# Patient Record
Sex: Female | Born: 1950 | Race: Black or African American | Hispanic: No | Marital: Married | Smoking: Never smoker
Health system: Southern US, Community
[De-identification: ages and names within clinical notes are randomized; demographics above are authoritative.]

## PROBLEM LIST (undated history)

## (undated) DIAGNOSIS — I10 Essential (primary) hypertension: Secondary | ICD-10-CM

## (undated) DIAGNOSIS — H409 Unspecified glaucoma: Secondary | ICD-10-CM

## (undated) DIAGNOSIS — E739 Lactose intolerance, unspecified: Secondary | ICD-10-CM

## (undated) DIAGNOSIS — N6012 Diffuse cystic mastopathy of left breast: Secondary | ICD-10-CM

## (undated) DIAGNOSIS — G459 Transient cerebral ischemic attack, unspecified: Secondary | ICD-10-CM

## (undated) DIAGNOSIS — M7712 Lateral epicondylitis, left elbow: Secondary | ICD-10-CM

## (undated) DIAGNOSIS — E785 Hyperlipidemia, unspecified: Secondary | ICD-10-CM

## (undated) HISTORY — DX: Essential (primary) hypertension: I10

## (undated) HISTORY — DX: Lateral epicondylitis, left elbow: M77.12

## (undated) HISTORY — PX: EYE SURGERY: SHX253

## (undated) HISTORY — DX: Unspecified glaucoma: H40.9

## (undated) HISTORY — DX: Hyperlipidemia, unspecified: E78.5

## (undated) HISTORY — DX: Transient cerebral ischemic attack, unspecified: G45.9

## (undated) HISTORY — DX: Diffuse cystic mastopathy of left breast: N60.12

## (undated) HISTORY — DX: Lactose intolerance, unspecified: E73.9

---

## 1999-05-21 HISTORY — PX: COSMETIC SURGERY: SHX468

## 1999-07-31 ENCOUNTER — Other Ambulatory Visit: Admission: RE | Admit: 1999-07-31 | Discharge: 1999-07-31 | Payer: Self-pay | Admitting: Family Medicine

## 1999-09-05 ENCOUNTER — Encounter: Admission: RE | Admit: 1999-09-05 | Discharge: 1999-09-05 | Payer: Self-pay | Admitting: Family Medicine

## 1999-09-05 ENCOUNTER — Encounter: Payer: Self-pay | Admitting: Family Medicine

## 2001-05-07 ENCOUNTER — Other Ambulatory Visit: Admission: RE | Admit: 2001-05-07 | Discharge: 2001-05-07 | Payer: Self-pay | Admitting: Family Medicine

## 2001-05-11 ENCOUNTER — Encounter: Admission: RE | Admit: 2001-05-11 | Discharge: 2001-05-11 | Payer: Self-pay | Admitting: Family Medicine

## 2001-05-11 ENCOUNTER — Encounter: Payer: Self-pay | Admitting: Family Medicine

## 2001-09-25 ENCOUNTER — Inpatient Hospital Stay (HOSPITAL_COMMUNITY): Admission: RE | Admit: 2001-09-25 | Discharge: 2001-09-26 | Payer: Self-pay | Admitting: Obstetrics and Gynecology

## 2002-05-20 HISTORY — PX: ABDOMINAL HYSTERECTOMY: SHX81

## 2005-06-24 ENCOUNTER — Observation Stay (HOSPITAL_COMMUNITY): Admission: EM | Admit: 2005-06-24 | Discharge: 2005-06-25 | Payer: Self-pay | Admitting: Emergency Medicine

## 2005-07-08 ENCOUNTER — Encounter: Admission: RE | Admit: 2005-07-08 | Discharge: 2005-07-08 | Payer: Self-pay | Admitting: Family Medicine

## 2007-09-02 ENCOUNTER — Ambulatory Visit: Payer: Self-pay | Admitting: Ophthalmology

## 2008-03-02 ENCOUNTER — Ambulatory Visit: Payer: Self-pay | Admitting: Family Medicine

## 2010-12-24 ENCOUNTER — Emergency Department: Payer: Self-pay | Admitting: Emergency Medicine

## 2011-02-12 ENCOUNTER — Ambulatory Visit: Payer: Self-pay | Admitting: Family Medicine

## 2011-02-12 LAB — HM MAMMOGRAPHY: HM MAMMO: NORMAL

## 2011-03-20 LAB — HM COLONOSCOPY: HM Colonoscopy: NORMAL

## 2013-02-14 ENCOUNTER — Emergency Department: Payer: Self-pay | Admitting: Emergency Medicine

## 2013-02-14 LAB — BASIC METABOLIC PANEL
Anion Gap: 4 — ABNORMAL LOW (ref 7–16)
BUN: 13 mg/dL (ref 7–18)
Co2: 28 mmol/L (ref 21–32)
EGFR (African American): >60
Osmolality: 274 (ref 275–301)
Sodium: 137 mmol/L (ref 136–145)

## 2013-02-14 LAB — TROPONIN I: Troponin-I: 0.04 ng/mL

## 2013-02-14 LAB — CBC
HGB: 15 g/dL (ref 12.0–16.0)
MCH: 30 pg (ref 26.0–34.0)
MCV: 87 fL (ref 80–100)
Platelet: 311 10*3/uL (ref 150–440)
RDW: 13.2 % (ref 11.5–14.5)

## 2013-11-11 ENCOUNTER — Ambulatory Visit: Payer: Self-pay | Admitting: Family Medicine

## 2013-12-11 IMAGING — CT CT HEAD WITHOUT CONTRAST
2 series · 15 of 30 positions shown, 19 images · non-contrast
Comparison: none

REASON FOR EXAM: headache
COMMENTS:

[Series 2: soft tissue · axial · 0.43mm/px · z∈[-122,-102]mm · 2 of 32 slices shown]
[im 3/32  brain]
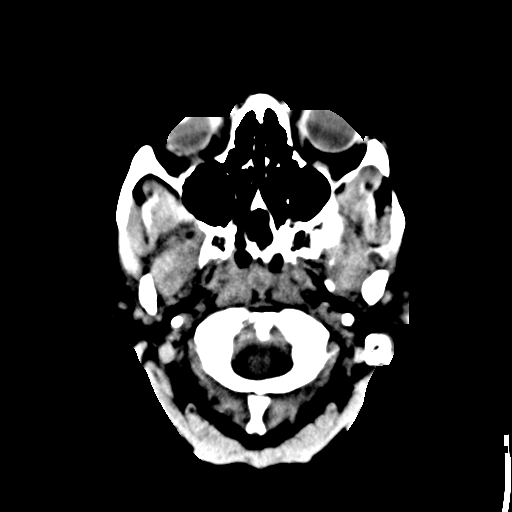
[im 7/32  brain]
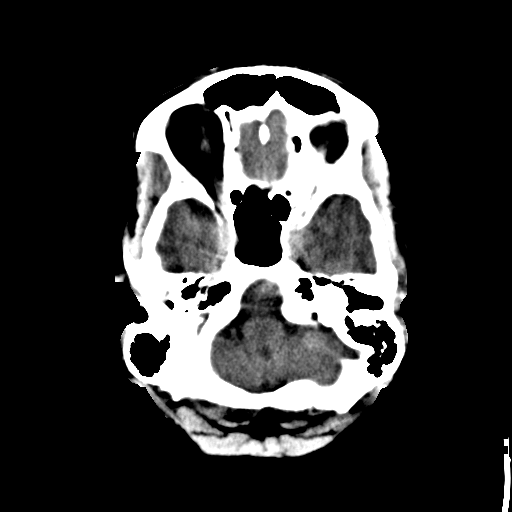

[Series 5: soft tissue recon · axial · 0.43mm/px · z∈[-157,-30]mm · 13 of 33 slices shown, 17 images]
[im 3/33  brain]
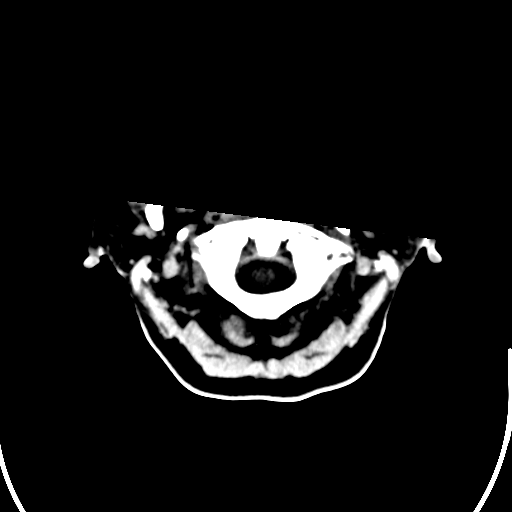
[im 3/33  bone]
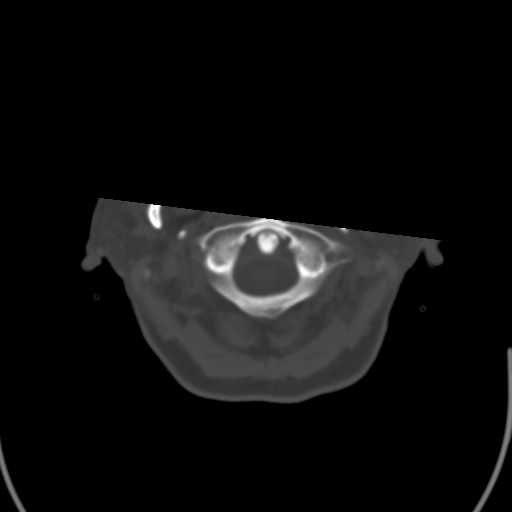
[im 5/33  brain]
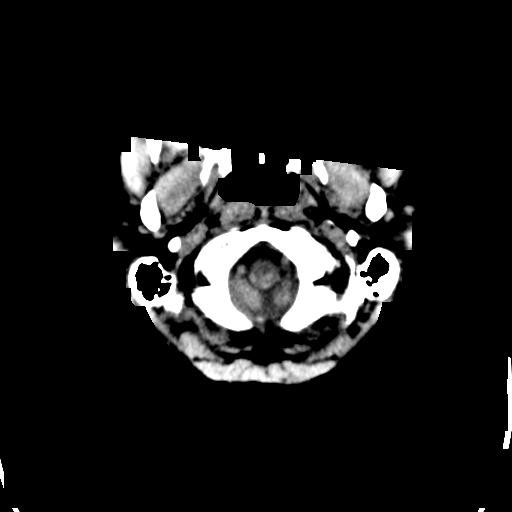
[im 7/33  brain]
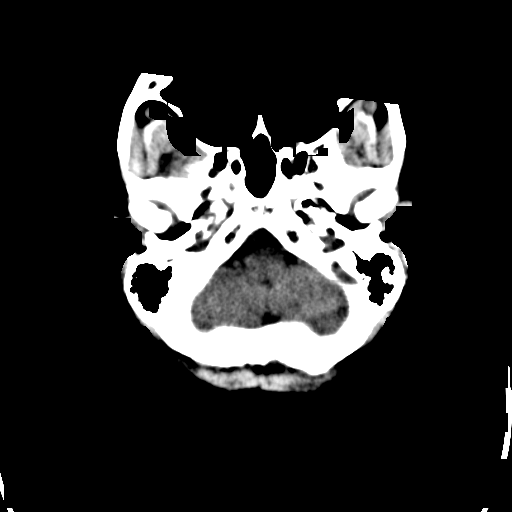
[im 10/33  brain]
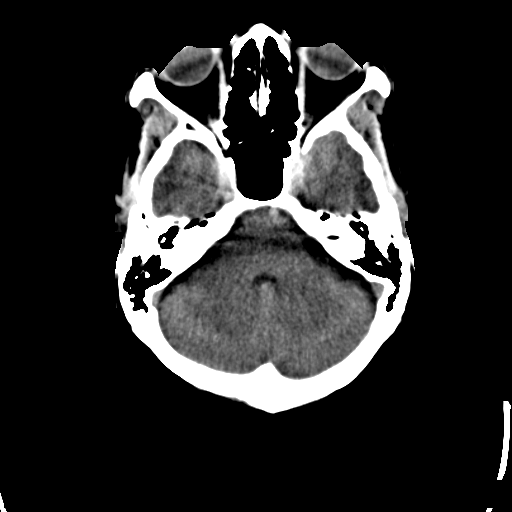
[im 12/33  brain]
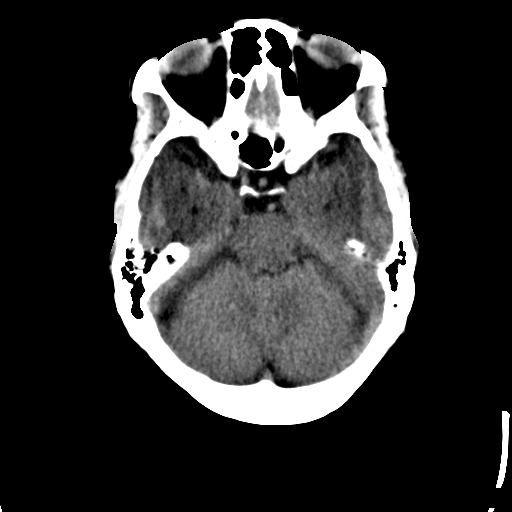
[im 12/33  bone]
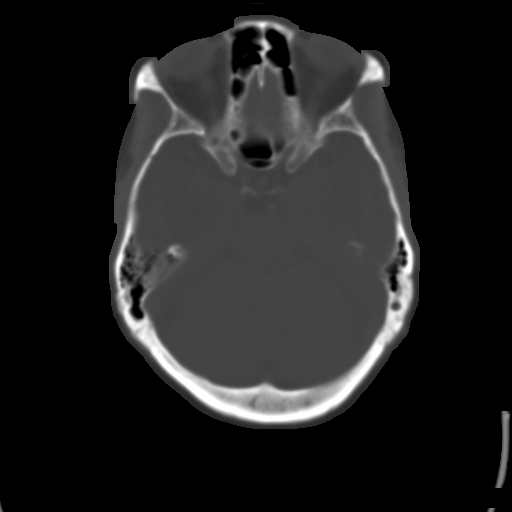
[im 14/33  brain]
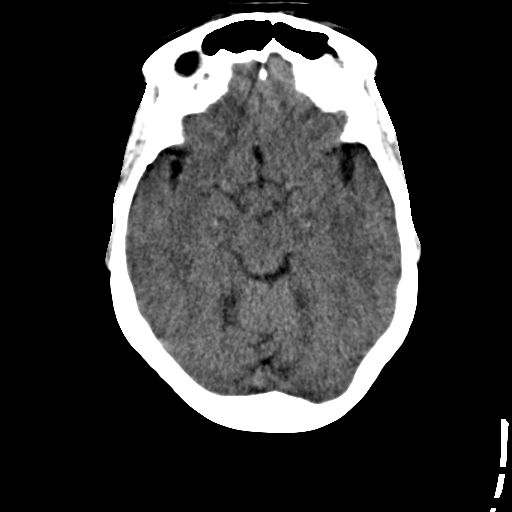
[im 17/33  brain]
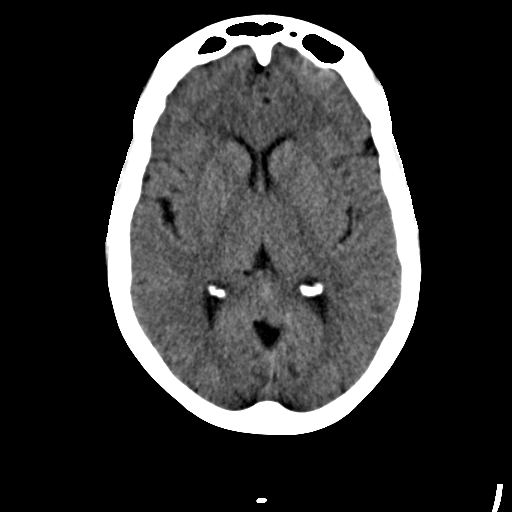
[im 19/33  brain]
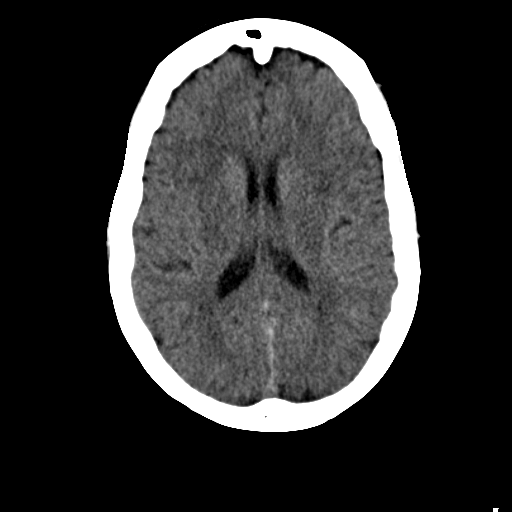
[im 21/33  brain]
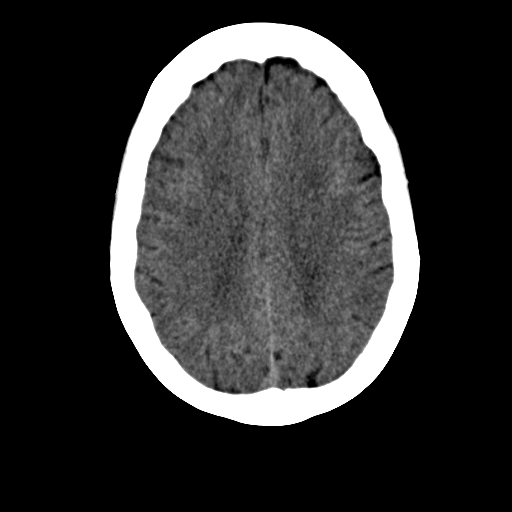
[im 21/33  bone]
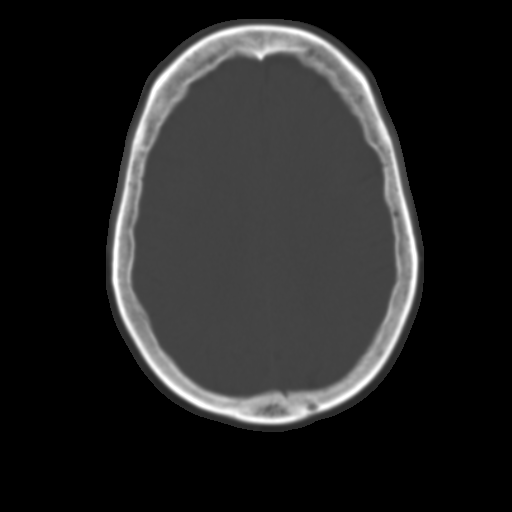
[im 23/33  brain]
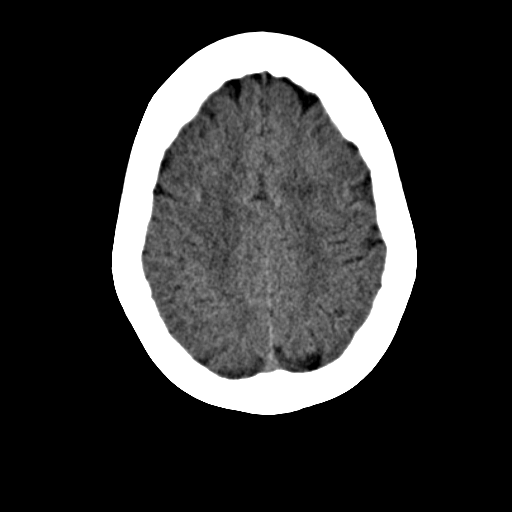
[im 26/33  brain]
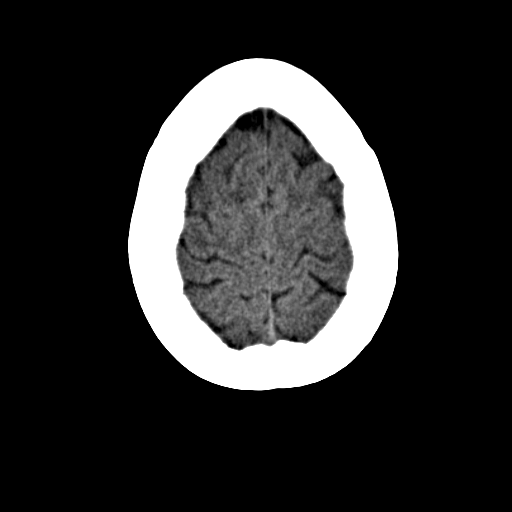
[im 28/33  brain]
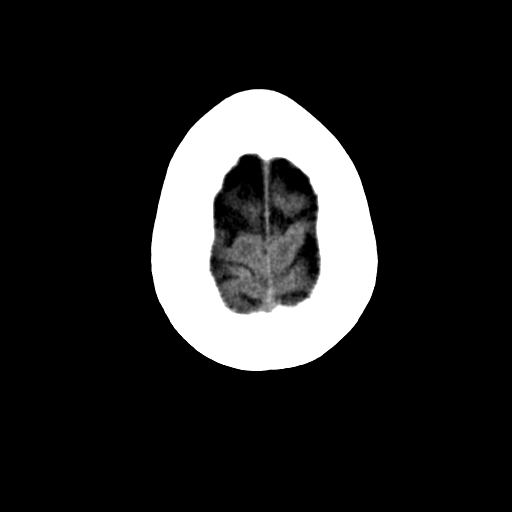
[im 30/33  brain]
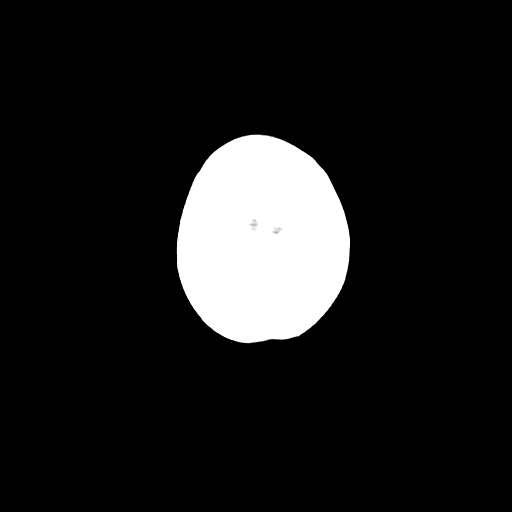
[im 30/33  bone]
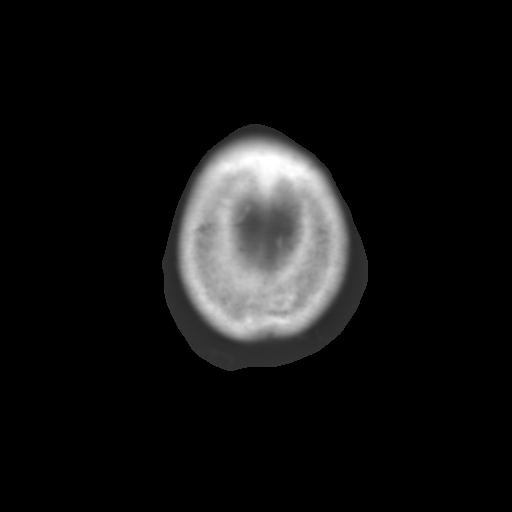

[15 of 30 positions shown; findings below may reference images not displayed]

PROCEDURE:     CT  - CT HEAD WITHOUT CONTRAST  - February 14, 2013  [DATE]

RESULT:     Axial noncontrast CT scanning was performed through the brain
with reconstructions at 5 mm intervals and slice thicknesses.

The ventricles are normal in size and position. There is no intracranial
hemorrhage nor intracranial mass effect. On images 12 and 13 there is an
approximately 8mm diameter hypodensity that may reflect the sequelae of a
previous lacunar infarction or could reflect a dilated Virchow-Robin space
in the insula on the right. The cerebellum and brainstem are normal in
density. At bone window settings the observed portions of the paranasal
sinuses and mastoid air cells are clear. There is no evidence of an acute
skull fracture.
IMPRESSION: 1. There is no evidence of an acute ischemic or hemorrhagic infarction.
2. There may be an old subcentimeter lacunar infarction in the insula on the
right.
3. There is no intracranial mass effect nor hydrocephalus.

[REDACTED]

## 2014-10-11 LAB — LIPID PANEL
Cholesterol: 138 mg/dL (ref 0–200)
HDL: 62 mg/dL (ref 35–70)
LDL Cholesterol: 63 mg/dL
Triglycerides: 66 mg/dL (ref 40–160)

## 2014-12-24 ENCOUNTER — Encounter: Payer: Self-pay | Admitting: Family Medicine

## 2014-12-24 DIAGNOSIS — E785 Hyperlipidemia, unspecified: Secondary | ICD-10-CM | POA: Insufficient documentation

## 2014-12-24 DIAGNOSIS — Z91010 Allergy to peanuts: Secondary | ICD-10-CM | POA: Insufficient documentation

## 2014-12-24 DIAGNOSIS — I10 Essential (primary) hypertension: Secondary | ICD-10-CM | POA: Insufficient documentation

## 2014-12-24 DIAGNOSIS — M7712 Lateral epicondylitis, left elbow: Secondary | ICD-10-CM | POA: Insufficient documentation

## 2014-12-24 DIAGNOSIS — H4040X0 Glaucoma secondary to eye inflammation, unspecified eye, stage unspecified: Secondary | ICD-10-CM | POA: Insufficient documentation

## 2014-12-24 DIAGNOSIS — N6019 Diffuse cystic mastopathy of unspecified breast: Secondary | ICD-10-CM | POA: Insufficient documentation

## 2014-12-24 DIAGNOSIS — Z8673 Personal history of transient ischemic attack (TIA), and cerebral infarction without residual deficits: Secondary | ICD-10-CM | POA: Insufficient documentation

## 2014-12-26 ENCOUNTER — Encounter: Payer: Self-pay | Admitting: Family Medicine

## 2015-01-30 ENCOUNTER — Other Ambulatory Visit: Payer: Self-pay | Admitting: Family Medicine

## 2015-01-30 NOTE — Telephone Encounter (Signed)
Patient requesting refill. 

## 2015-02-02 NOTE — Telephone Encounter (Signed)
Left message to return call to schedule appointment

## 2015-02-09 ENCOUNTER — Other Ambulatory Visit: Payer: Self-pay | Admitting: Family Medicine

## 2015-02-09 NOTE — Telephone Encounter (Signed)
Patient requesting refill. 

## 2015-03-08 ENCOUNTER — Other Ambulatory Visit: Payer: Self-pay

## 2015-03-08 ENCOUNTER — Telehealth: Payer: Self-pay | Admitting: Family Medicine

## 2015-03-08 MED ORDER — LISINOPRIL 40 MG PO TABS: 40.0000 mg | ORAL_TABLET | Freq: Every day | ORAL | Status: DC

## 2015-03-08 MED ORDER — AMLODIPINE-ATORVASTATIN 5-20 MG PO TABS: 1.0000 | ORAL_TABLET | Freq: Every day | ORAL | Status: DC

## 2015-03-08 NOTE — Telephone Encounter (Signed)
Needs BP and Cholesterol meds refilled. [facility] [address].

## 2015-03-13 ENCOUNTER — Ambulatory Visit (INDEPENDENT_AMBULATORY_CARE_PROVIDER_SITE_OTHER): Payer: BLUE CROSS/BLUE SHIELD | Admitting: Family Medicine

## 2015-03-13 ENCOUNTER — Encounter: Payer: Self-pay | Admitting: Family Medicine

## 2015-03-13 VITALS — BP 122/84 | HR 70 | Temp 98.6°F | Resp 16 | Ht 63.0 in | Wt 132.9 lb

## 2015-03-13 DIAGNOSIS — Z23 Encounter for immunization: Secondary | ICD-10-CM | POA: Diagnosis not present

## 2015-03-13 DIAGNOSIS — I10 Essential (primary) hypertension: Secondary | ICD-10-CM

## 2015-03-13 DIAGNOSIS — N6019 Diffuse cystic mastopathy of unspecified breast: Secondary | ICD-10-CM

## 2015-03-13 DIAGNOSIS — E785 Hyperlipidemia, unspecified: Secondary | ICD-10-CM | POA: Diagnosis not present

## 2015-03-13 DIAGNOSIS — Z1239 Encounter for other screening for malignant neoplasm of breast: Secondary | ICD-10-CM

## 2015-03-13 DIAGNOSIS — N63 Unspecified lump in unspecified breast: Secondary | ICD-10-CM

## 2015-03-13 MED ORDER — AMLODIPINE-ATORVASTATIN 5-20 MG PO TABS: 1.0000 | ORAL_TABLET | Freq: Every day | ORAL | Status: DC

## 2015-03-13 MED ORDER — LISINOPRIL 40 MG PO TABS: 40.0000 mg | ORAL_TABLET | Freq: Every day | ORAL | Status: DC

## 2015-03-13 NOTE — Progress Notes (Signed)
Name: Bianca Suarez   MRN: [id number]    DOB: 02-Aug-1950   Date:03/13/2015       Progress Note  Subjective  Chief Complaint  Chief Complaint  Patient presents with  . Medication Refill    follow-up  . Hypertension  . Hyperlipidemia    some leg cramps    HPI  HTN: taking bp medication daily, but ran out a couple of weeks ago of Lisinopril, no cough, no chest pain or palpitation.   Hyperlipidemia: taking Caduet and last lipid panel was done 09/2104 and LDl was 63.  Continue medication, previous history of TIA, but no neuro deficits at this time. She states she has episodes of feet cramps, when she moves her foot in a certain way.   Fibrocystic breast disease: had a diagnostic mammogram on the left side, years ago, still has some intermittent pain on left breast.  No masses, no nipple discharge.    Patient Active Problem List   Diagnosis Date Noted  . Dyslipidemia 12/24/2014  . Fibrocystic breast disease 12/24/2014  . 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 H/O transient cerebral ischemia 12/24/2014  . Benign hypertension 12/24/2014  . Lateral epicondylitis of left elbow 12/24/2014    Past Surgical History  Procedure Laterality Date  . Abdominal hysterectomy  [id number]  . Eye surgery    . Cosmetic surgery  2001    lens replacement    Family History  Problem Relation Age of Onset  . Diabetes Mother   . Hypertension Mother   . Hypertension Father   . CVA Father     Social History   Social History  . Marital Status: Married    Spouse Name: N/A  . Number of Children: N/A  . Years of Education: N/A   Occupational History  . Not on file.   Social History Main Topics  . Smoking status: Never Smoker   . Smokeless tobacco: Never Used  . Alcohol Use: Not on file  . Drug Use: No  . Sexual Activity: Yes   Other Topics Concern  . Not on file   Social History Narrative     Current outpatient  prescriptions:  .  aspirin 81 MG tablet, Take 1 tablet by mouth daily., Disp: , Rfl:  .  amLODipine-atorvastatin (CADUET) 5-20 MG tablet, Take 1 tablet by mouth daily., Disp: 90 tablet, Rfl: 1 .  lisinopril (PRINIVIL,ZESTRIL) 40 MG tablet, Take 1 tablet (40 mg total) by mouth daily., Disp: 90 tablet, Rfl: 1  Allergies  Allergen Reactions  . Peanut Oil      ROS  Constitutional: Negative for fever or weight change.  Respiratory: Negative for cough and shortness of breath.   Cardiovascular: Negative for chest pain or palpitations.  Gastrointestinal: Negative for abdominal pain, no bowel changes.  Musculoskeletal: Negative for gait problem or joint swelling.  Skin: Negative for rash.  Neurological: Negative for dizziness or headache.  No other specific complaints in a complete review of systems (except as listed in HPI above).  Objective  Filed Vitals:   03/13/15 1422  BP: 122/84  Pulse: 70  Temp: 98.6 F (37 C)  TempSrc: Oral  Resp: 16  Height: 5\' 3"  (1.6 m)  Weight: 132 lb 14.4 oz (60.283 kg)  SpO2: 98%    Body mass index is 23.55 kg/(m^2).  Physical Exam  Constitutional: Patient appears well-developed and well-nourished.  No distress.  HEENT: head atraumatic, normocephalic, pupils equal and reactive to light,  neck supple, throat within normal limits Cardiovascular: Normal rate, regular rhythm and normal heart sounds.  No murmur heard. No BLE edema. Pulmonary/Chest: Effort normal and breath sounds normal. No respiratory distress. Abdominal: Soft.  There is no tenderness. Psychiatric: Patient has a normal mood and affect. behavior is normal. Judgment and thought content normal.  PHQ2/9: Depression screen PHQ 2/9 03/13/2015  Decreased Interest 0  Down, Depressed, Hopeless 0  PHQ - 2 Score 0    Fall Risk: Fall Risk  03/13/2015 03/13/2015  Falls in the past year? Yes Yes  Number falls in past yr: 1 1  Injury with Fall? No No    Functional Status Survey: Is the  patient deaf or have difficulty hearing?: No Does the patient have difficulty seeing, even when wearing glasses/contacts?: Yes (glasses) Does the patient have difficulty concentrating, remembering, or making decisions?: No Does the patient have difficulty walking or climbing stairs?: No Does the patient have difficulty dressing or bathing?: No Does the patient have difficulty doing errands alone such as visiting a doctor's office or shopping?: No    Assessment & Plan  1. Benign hypertension  Doing well, continue medication  - lisinopril (PRINIVIL,ZESTRIL) 40 MG tablet; Take 1 tablet (40 mg total) by mouth daily.  Dispense: 90 tablet; Refill: 1 - amLODipine-atorvastatin (CADUET) 5-20 MG tablet; Take 1 tablet by mouth daily.  Dispense: 90 tablet; Refill: 1  2. Needs flu shot  - Flu Vaccine QUAD 36+ mos PF IM (Fluarix & Fluzone Quad PF) - refused  3. Breast cancer screening  - MM Digital Diagnostic Bilat; Future  4. Need for shingles vaccine  - Varicella-zoster vaccine subcutaneous -refused  5. Dyslipidemia  Continue Caduet  6. Fibrocystic breast disease, unspecified laterality  Reassurance, but due for mammogram

## 2015-09-14 ENCOUNTER — Encounter: Payer: BLUE CROSS/BLUE SHIELD | Admitting: Family Medicine

## 2015-09-20 ENCOUNTER — Other Ambulatory Visit: Payer: Self-pay | Admitting: Family Medicine

## 2015-09-20 NOTE — Telephone Encounter (Signed)
Patient requesting refill. 

## 2015-10-05 ENCOUNTER — Encounter: Payer: Self-pay | Admitting: Family Medicine

## 2015-10-05 ENCOUNTER — Ambulatory Visit (INDEPENDENT_AMBULATORY_CARE_PROVIDER_SITE_OTHER): Payer: BLUE CROSS/BLUE SHIELD | Admitting: Family Medicine

## 2015-10-05 VITALS — BP 182/94 | HR 88 | Temp 97.5°F | Resp 16 | Ht 63.0 in | Wt 140.0 lb

## 2015-10-05 DIAGNOSIS — E785 Hyperlipidemia, unspecified: Secondary | ICD-10-CM

## 2015-10-05 DIAGNOSIS — M25362 Other instability, left knee: Secondary | ICD-10-CM

## 2015-10-05 DIAGNOSIS — I10 Essential (primary) hypertension: Secondary | ICD-10-CM | POA: Diagnosis not present

## 2015-10-05 DIAGNOSIS — Z0000 Encounter for general adult medical examination without abnormal findings: Secondary | ICD-10-CM

## 2015-10-05 DIAGNOSIS — M25461 Effusion, right knee: Secondary | ICD-10-CM

## 2015-10-05 DIAGNOSIS — Z01419 Encounter for gynecological examination (general) (routine) without abnormal findings: Secondary | ICD-10-CM

## 2015-10-05 MED ORDER — AMLODIPINE-ATORVASTATIN 10-20 MG PO TABS: 1.0000 | ORAL_TABLET | Freq: Every day | ORAL | Status: DC

## 2015-10-05 NOTE — Progress Notes (Signed)
Name: Bianca Suarez   MRN: [id number]    DOB: 1951-02-23   Date:10/05/2015       Progress Note  Subjective  Chief Complaint  Chief Complaint  Patient presents with  . Annual Exam    patient is here for CPE, no PAP    HPI  Well Woman: history of hysterectomy for DUB years ago, she never had abnormal pap smears. No vaginal discharge. She has mild stress incontinence.   HTN: bp is up today, last visit it was normal, patient states currently generic Caduet and does not seem to be working for her, she will try to fill at a different pharmacy. Advised to monitor bp at home  we will change dose of Caduet to 10/20. No chest pain or palpitation  Hyperlipidemia: taking Atorvastatin, no myalgias.  Right knee Effusion: she has noticed swelling on the right knee, no pain, no increase in warmth  Left knee instability: she states that left knee gives out when she is going steps and sometimes the leg rolls a little when walking, no pain, symptoms started over the past year, it is not getting worse.    Patient Active Problem List   Diagnosis Date Noted  . Dyslipidemia 12/24/2014  . Fibrocystic breast disease 12/24/2014  . 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 H/O transient cerebral ischemia 12/24/2014  . Benign hypertension 12/24/2014  . Lateral epicondylitis of left elbow 12/24/2014    Past Surgical History  Procedure Laterality Date  . Abdominal hysterectomy  [id number]  . Eye surgery    . Cosmetic surgery  2001    lens replacement    Family History  Problem Relation Age of Onset  . Diabetes Mother   . Hypertension Mother   . Hypertension Father   . CVA Father     Social History   Social History  . Marital Status: Married    Spouse Name: N/A  . Number of Children: N/A  . Years of Education: N/A   Occupational History  . Not on file.   Social History Main Topics  . Smoking status: Never Smoker   . Smokeless  tobacco: Never Used  . Alcohol Use: Not on file  . Drug Use: No  . Sexual Activity: Yes   Other Topics Concern  . Not on file   Social History Narrative     Current outpatient prescriptions:  .  aspirin 81 MG tablet, Take 1 tablet by mouth daily., Disp: , Rfl:  .  lisinopril (PRINIVIL,ZESTRIL) 40 MG tablet, Take 1 tablet (40 mg total) by mouth daily., Disp: 90 tablet, Rfl: 1 .  amlodipine-atorvastatin (CADUET) 10-20 MG tablet, Take 1 tablet by mouth daily., Disp: 30 tablet, Rfl: 0  Allergies  Allergen Reactions  . Peanut Oil Diarrhea     ROS  Constitutional: Negative for fever , positive for weight change.  Respiratory: Negative for cough and shortness of breath.   Cardiovascular: Negative for chest pain or palpitations.  Gastrointestinal: Negative for abdominal pain, no bowel changes.  Musculoskeletal: Positive  for gait problem or joint swelling.  Skin: Negative for rash.  Neurological: Negative for dizziness or headache.  No other specific complaints in a complete review of systems (except as listed in HPI above).  Objective  Filed Vitals:   10/05/15 1129 10/05/15 1216  BP: 158/96 182/94  Pulse: 88   Temp: 97.5 F (36.4 C)   TempSrc: Oral   Resp: 16   Height: 5\' 3"  (  1.6 m)   Weight: 140 lb (63.504 kg)   SpO2: 97%     Body mass index is 24.81 kg/(m^2).  Physical Exam  Constitutional: Patient appears well-developed and well-nourished. No distress.  HENT: Head: Normocephalic and atraumatic. Ears: B TMs ok, no erythema or effusion; Nose: Nose normal. Mouth/Throat: Oropharynx is clear and moist. No oropharyngeal exudate.  Eyes: Conjunctivae and EOM are normal. Pupils are equal, round, and reactive to light. No scleral icterus.  Neck: Normal range of motion. Neck supple. No JVD present. No thyromegaly present.  Cardiovascular: Normal rate, regular rhythm and normal heart sounds.  No murmur heard. No BLE edema. Pulmonary/Chest: Effort normal and breath sounds  normal. No respiratory distress. Abdominal: Soft. Bowel sounds are normal, no distension. There is no tenderness. no masses Breast: no lumps or masses, no nipple discharge or rashes FEMALE GENITALIA:  Not done RECTAL: not done Musculoskeletal: Normal range of motion, no joint effusions. No gross deformities Neurological: he is alert and oriented to person, place, and time. No cranial nerve deficit. Coordination, balance, strength, speech and gait are normal.  Skin: Skin is warm and dry. No rash noted. No erythema.  Psychiatric: Patient has a normal mood and affect. behavior is normal. Judgment and thought content normal.   PHQ2/9: Depression screen [facility] 2/9 10/05/2015 03/13/2015  Decreased Interest 0 0  Down, Depressed, Hopeless 0 0  PHQ - 2 Score 0 0    Fall Risk: Fall Risk  10/05/2015 03/13/2015 03/13/2015  Falls in the past year? No Yes Yes  Number falls in past yr: - 1 1  Injury with Fall? - No No    Functional Status Survey: Is the patient deaf or have difficulty hearing?: No Does the patient have difficulty seeing, even when wearing glasses/contacts?: No (patient stated that she thinks she has eczema or psorasis in one of her eyes) Does the patient have difficulty concentrating, remembering, or making decisions?: No Does the patient have difficulty walking or climbing stairs?: Yes (some issues with balance at times) Does the patient have difficulty dressing or bathing?: No Does the patient have difficulty doing errands alone such as visiting a doctor's office or shopping?: No    Assessment & Plan  1. Well woman exam  Discussed with adolescent  and caregiver the importance of limiting screen time to no more than 2 hours per day, exercise daily for at least 2 hours, eat 6 servings of fruit and vegetables daily, eat tree nuts ( pistachios, pecans , almonds...) one serving every other day, eat fish twice weekly. Read daily. Get involved in school. Have responsibilities  at  home. To avoid STI's, practice abstinence, if unable use condoms and stick with one partner.  Discussed importance of contraception if sexually active to avoid unwanted pregnancy.   2. Benign hypertension  - Comprehensive metabolic panel BP is not controlled, explained we will increase dose and follow up in one month  3. Dyslipidemia  - Lipid panel  4. Knee effusion, right  - Ambulatory referral to Orthopedic Surgery  5. Knee instability, left  - Ambulatory referral to Orthopedic Surgery

## 2015-11-17 ENCOUNTER — Other Ambulatory Visit: Payer: Self-pay | Admitting: Family Medicine

## 2016-01-10 ENCOUNTER — Other Ambulatory Visit: Payer: Self-pay | Admitting: Family Medicine

## 2016-01-10 DIAGNOSIS — I10 Essential (primary) hypertension: Secondary | ICD-10-CM

## 2016-01-10 NOTE — Telephone Encounter (Signed)
Patient requesting refill of Lisinopril be sent to [facility] in [place].

## 2016-02-10 ENCOUNTER — Telehealth: Payer: Self-pay | Admitting: Family Medicine

## 2016-02-10 DIAGNOSIS — I10 Essential (primary) hypertension: Secondary | ICD-10-CM

## 2016-02-12 NOTE — Telephone Encounter (Signed)
Appointment made for 04/02/16.

## 2016-04-02 ENCOUNTER — Encounter: Payer: Self-pay | Admitting: Family Medicine

## 2016-04-02 ENCOUNTER — Ambulatory Visit (INDEPENDENT_AMBULATORY_CARE_PROVIDER_SITE_OTHER): Payer: BLUE CROSS/BLUE SHIELD | Admitting: Family Medicine

## 2016-04-02 VITALS — BP 172/102 | HR 68 | Temp 98.1°F | Resp 16 | Ht 63.0 in | Wt 138.9 lb

## 2016-04-02 DIAGNOSIS — I10 Essential (primary) hypertension: Secondary | ICD-10-CM

## 2016-04-02 DIAGNOSIS — E785 Hyperlipidemia, unspecified: Secondary | ICD-10-CM | POA: Diagnosis not present

## 2016-04-02 DIAGNOSIS — M1711 Unilateral primary osteoarthritis, right knee: Secondary | ICD-10-CM | POA: Diagnosis not present

## 2016-04-02 DIAGNOSIS — Z9181 History of falling: Secondary | ICD-10-CM | POA: Diagnosis not present

## 2016-04-02 LAB — CBC WITH DIFFERENTIAL/PLATELET
BASOS ABS: 51 {cells}/uL (ref 0–200)
Basophils Relative: 1 %
EOS PCT: 1 %
Eosinophils Absolute: 51 cells/uL (ref 15–500)
HEMATOCRIT: 41.1 % (ref 35.0–45.0)
Hemoglobin: 14 g/dL (ref 11.7–15.5)
LYMPHS PCT: 32 %
Lymphs Abs: 1632 cells/uL (ref 850–3900)
MCH: 29.9 pg (ref 27.0–33.0)
MCHC: 34.1 g/dL (ref 32.0–36.0)
MCV: 87.8 fL (ref 80.0–100.0)
MONO ABS: 408 {cells}/uL (ref 200–950)
MPV: 8.7 fL (ref 7.5–12.5)
Monocytes Relative: 8 %
NEUTROS PCT: 58 %
Neutro Abs: 2958 cells/uL (ref 1500–7800)
Platelets: 337 10*3/uL (ref 140–400)
RBC: 4.68 MIL/uL (ref 3.80–5.10)
RDW: 13.4 % (ref 11.0–15.0)
WBC: 5.1 10*3/uL (ref 3.8–10.8)

## 2016-04-02 LAB — COMPLETE METABOLIC PANEL WITH GFR
ALBUMIN: 4.5 g/dL (ref 3.6–5.1)
ALK PHOS: 99 U/L (ref 33–130)
ALT: 25 U/L (ref 6–29)
AST: 23 U/L (ref 10–35)
BUN: 13 mg/dL (ref 7–25)
CALCIUM: 10 mg/dL (ref 8.6–10.4)
CO2: 27 mmol/L (ref 20–31)
CREATININE: 0.87 mg/dL (ref 0.50–0.99)
Chloride: 104 mmol/L (ref 98–110)
GFR, Est African American: 82 mL/min (ref >=60)
GFR, Est Non African American: 71 mL/min (ref >=60)
GLUCOSE: 96 mg/dL (ref 65–99)
Potassium: 5.1 mmol/L (ref 3.5–5.3)
Sodium: 139 mmol/L (ref 135–146)
TOTAL PROTEIN: 7.6 g/dL (ref 6.1–8.1)
Total Bilirubin: 0.9 mg/dL (ref 0.2–1.2)

## 2016-04-02 LAB — LIPID PANEL
Cholesterol: 153 mg/dL (ref <200)
HDL: 72 mg/dL (ref >50)
LDL CALC: 71 mg/dL (ref <100)
TRIGLYCERIDES: 51 mg/dL (ref <150)
Total CHOL/HDL Ratio: 2.1 Ratio (ref <5.0)
VLDL: 10 mg/dL (ref <30)

## 2016-04-02 LAB — POCT UA - MICROALBUMIN: Microalbumin Ur, POC: 20 mg/L

## 2016-04-02 LAB — TSH: TSH: 0.81 mIU/L

## 2016-04-02 MED ORDER — HYDROCHLOROTHIAZIDE 12.5 MG PO TABS: 12.5000 mg | ORAL_TABLET | Freq: Every day | ORAL | 0 refills | Status: DC

## 2016-04-02 NOTE — Progress Notes (Addendum)
Name: Bianca Suarez   MRN: [id number]    DOB: February 26, 1951   Date:04/02/2016       Progress Note  Subjective  Chief Complaint  Chief Complaint  Patient presents with  . Medication Refill    6 month F/U  . Hypertension    Denies any side effect of HTN  . Hyperlipidemia    Patient states she only takes half a tablet daily due to being too strong-Due to making her stay up all night and not being able to fall asleep. When she takes a half she can go to sleep normally.    HPI  HTN: bp is uncontrolled, no chest pain or palpitation, she is only taking half dose of Caduet because it affected her sleep, taking Lisinopril, bp is very high , we will check labs and if no improvement in bp within the next month we will refer her to Nephrologist   Hyperlipidemia: taking Atorvastatin, no myalgias. She has occasional left foot cramp, but resolved with a soap under her bed.  Right knee Effusion/ Right knee OA: she has noticed swelling on the right knee, no pain, no increase in warmth, she has some grinding on her knee, she prefers not to have  X-ray at this time.   Patient Active Problem List   Diagnosis Date Noted  . Dyslipidemia 12/24/2014  . Fibrocystic breast disease 12/24/2014  . 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 H/O transient cerebral ischemia 12/24/2014  . Benign hypertension 12/24/2014  . Lateral epicondylitis of left elbow 12/24/2014    Past Surgical History:  Procedure Laterality Date  . ABDOMINAL HYSTERECTOMY  [id number]  . COSMETIC SURGERY  2001   lens replacement  . EYE SURGERY      Family History  Problem Relation Age of Onset  . Diabetes Mother   . Hypertension Mother   . Hypertension Father   . CVA Father     Social History   Social History  . Marital status: Married    Spouse name: N/A  . Number of children: N/A  . Years of education: N/A   Occupational History  . Not on file.   Social History  Main Topics  . Smoking status: Never Smoker  . Smokeless tobacco: Never Used  . Alcohol use No  . Drug use: No  . Sexual activity: Yes    Partners: Male   Other Topics Concern  . Not on file   Social History Narrative  . No narrative on file     Current Outpatient Prescriptions:  .  amlodipine-atorvastatin (CADUET) 10-20 MG tablet, TAKE ONE TABLET BY MOUTH ONCE DAILY (Patient taking differently: TAKE A HALF OF TABLET BY MOUTH ONCE DAILY), Disp: 30 tablet, Rfl: 5 .  aspirin 81 MG tablet, Take 1 tablet by mouth daily., Disp: , Rfl:  .  lisinopril (PRINIVIL,ZESTRIL) 40 MG tablet, TAKE 1 TABLET(40 MG) BY MOUTH DAILY, Disp: 90 tablet, Rfl: 0 .  hydrochlorothiazide (HYDRODIURIL) 12.5 MG tablet, Take 1 tablet (12.5 mg total) by mouth daily., Disp: 30 tablet, Rfl: 0  Allergies  Allergen Reactions  . Peanut Oil Diarrhea     ROS  Constitutional: Negative for fever or weight change.  Respiratory: Negative for cough and shortness of breath.   Cardiovascular: Negative for chest pain or palpitations.  Gastrointestinal: Negative for abdominal pain, no bowel changes.  Musculoskeletal: Negative for gait problem or joint swelling.  Skin: Negative for rash.  Neurological: Negative for dizziness or  headache.  No other specific complaints in a complete review of systems (except as listed in HPI above).  Objective  Vitals:   04/02/16 0925 04/02/16 1019  BP: (!) 184/102 (!) 172/102  Pulse: 68   Resp: 16   Temp: 98.1 F (36.7 C)   TempSrc: Oral   SpO2: 97%   Weight: 138 lb 14.4 oz (63 kg)   Height: 5\' 3"  (1.6 m)     Body mass index is 24.61 kg/m.  Physical Exam  Constitutional: Patient appears well-developed and well-nourished.  No distress.  HEENT: head atraumatic, normocephalic, pupils equal and reactive to light,  neck supple, throat within normal limits Cardiovascular: Normal rate, regular rhythm and normal heart sounds.  No murmur heard. No BLE edema. Pulmonary/Chest:  Effort normal and breath sounds normal. No respiratory distress. Abdominal: Soft.  There is no tenderness. Psychiatric: Patient has a normal mood and affect. behavior is normal. Judgment and thought content normal.  PHQ2/9: Depression screen [facility] 2/9 04/02/2016 10/05/2015 03/13/2015  Decreased Interest 0 0 0  Down, Depressed, Hopeless 0 0 0  PHQ - 2 Score 0 0 0     Fall Risk: Fall Risk  04/02/2016 10/05/2015 03/13/2015 03/13/2015  Falls in the past year? Yes No Yes Yes  Number falls in past yr: 1 - 1 1  Injury with Fall? No - No No     Functional Status Survey: Is the patient deaf or have difficulty hearing?: No Does the patient have difficulty seeing, even when wearing glasses/contacts?: No Does the patient have difficulty concentrating, remembering, or making decisions?: No Does the patient have difficulty walking or climbing stairs?: No Does the patient have difficulty dressing or bathing?: No Does the patient have difficulty doing errands alone such as visiting a doctor's office or shopping?: No    Assessment & Plan  1. Uncontrolled hypertension  - EKG 12-Lead - hydrochlorothiazide (HYDRODIURIL) 12.5 MG tablet; Take 1 tablet (12.5 mg total) by mouth daily.  Dispense: 30 tablet; Refill: 0 - POCT UA - Microalbumin - TSH - COMPLETE METABOLIC PANEL WITH GFR - CBC with Differential/Platelet - Lipid panel - C-reactive protein  2. Dyslipidemia  - Lipid panel  3. Primary osteoarthritis of right knee  Reassurance, no pain at this time  4. History of recent fall  Lack of balance when she reached to pick up something on the floor, advised PT, but she wants to hold off.

## 2016-04-03 LAB — C-REACTIVE PROTEIN: CRP: 0.5 mg/L (ref <8.0)

## 2016-05-06 ENCOUNTER — Encounter: Payer: Self-pay | Admitting: Family Medicine

## 2016-05-06 ENCOUNTER — Ambulatory Visit (INDEPENDENT_AMBULATORY_CARE_PROVIDER_SITE_OTHER): Payer: BLUE CROSS/BLUE SHIELD | Admitting: Family Medicine

## 2016-05-06 VITALS — BP 158/98 | HR 80 | Temp 98.1°F | Resp 16 | Ht 63.0 in | Wt 139.5 lb

## 2016-05-06 DIAGNOSIS — I10 Essential (primary) hypertension: Secondary | ICD-10-CM | POA: Diagnosis not present

## 2016-05-06 DIAGNOSIS — R0789 Other chest pain: Secondary | ICD-10-CM

## 2016-05-06 MED ORDER — VALSARTAN-HYDROCHLOROTHIAZIDE 320-25 MG PO TABS: 1.0000 | ORAL_TABLET | Freq: Every day | ORAL | 0 refills | Status: DC

## 2016-05-06 MED ORDER — AMLODIPINE-ATORVASTATIN 10-20 MG PO TABS: 0.5000 | ORAL_TABLET | Freq: Two times a day (BID) | ORAL | 5 refills | Status: DC

## 2016-05-06 NOTE — Progress Notes (Signed)
Name: Bianca Suarez   MRN: [id number]    DOB: 06-24-1950   Date:05/06/2016       Progress Note  Subjective  Chief Complaint  Chief Complaint  Patient presents with  . Follow-up    1 month F/U  . Hypertension    Started since last visit taking a whole pill of Caduet instead of half of one. Patient denies any side effects, never started the HCTZ. Takes Lisinopril 1/2 in the morning and the other half at night and helps her sleep.    HPI  HTN: bp is uncontrolled,  palpitation, she is only taking half dose of Caduet because it affected her sleep, but since last visit she has been half of the Caduet twice daily, lisinopril in am, but she never started taking HCTZ.  BP is still high, we will change to Valsartan/hctz and recheck in one month. Start with half dose and increase to one pill after after one week.   Chest pain: she noticed mild dull ache on the left side of chest yesterday, previous episode about 8 months ago. She states that chest pain lasted almost all day, but resolved by itself. She has noticed some hoarseness and a mild cough over the past few days. She felt like it was after a coughing spell. No wheezing, SOB or fever. She has been painting lately and denies decrease in exercise tolerance. She is not taking aspirin   Patient Active Problem List   Diagnosis Date Noted  . Dyslipidemia 12/24/2014  . Fibrocystic breast disease 12/24/2014  . 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 H/O transient cerebral ischemia 12/24/2014  . Benign hypertension 12/24/2014  . Lateral epicondylitis of left elbow 12/24/2014    Past Surgical History:  Procedure Laterality Date  . ABDOMINAL HYSTERECTOMY  [id number]  . COSMETIC SURGERY  2001   lens replacement  . EYE SURGERY      Family History  Problem Relation Age of Onset  . Diabetes Mother   . Hypertension Mother   . Hypertension Father   . CVA Father     Social History    Social History  . Marital status: Married    Spouse name: N/A  . Number of children: N/A  . Years of education: N/A   Occupational History  . Not on file.   Social History Main Topics  . Smoking status: Never Smoker  . Smokeless tobacco: Never Used  . Alcohol use No  . Drug use: No  . Sexual activity: Yes    Partners: Male   Other Topics Concern  . Not on file   Social History Narrative  . No narrative on file     Current Outpatient Prescriptions:  .  amlodipine-atorvastatin (CADUET) 10-20 MG tablet, Take 0.5 tablets by mouth 2 (two) times daily., Disp: 30 tablet, Rfl: 5 .  aspirin 81 MG tablet, Take 1 tablet by mouth daily., Disp: , Rfl:  .  valsartan-hydrochlorothiazide (DIOVAN-HCT) 320-25 MG tablet, Take 1 tablet by mouth daily., Disp: 30 tablet, Rfl: 0  Allergies  Allergen Reactions  . Peanut Oil Diarrhea     ROS  Ten systems reviewed and is negative except as mentioned in HPI   Objective  Vitals:   05/06/16 1040  BP: (!) 158/98  Pulse: 80  Resp: 16  Temp: 98.1 F (36.7 C)  TempSrc: Oral  SpO2: 98%  Weight: 139 lb 8 oz (63.3 kg)  Height: _0  (1.6 m)  Body mass index is 24.71 kg/m.  Physical Exam  Constitutional: Patient appears well-developed and well-nourished.  No distress.  HEENT: head atraumatic, normocephalic, pupils equal and reactive to light,  neck supple, throat within normal limits Cardiovascular: Normal rate, regular rhythm and normal heart sounds.  No murmur heard. No BLE edema. Pulmonary/Chest: Effort normal and breath sounds normal. No respiratory distress. Abdominal: Soft.  There is no tenderness. Psychiatric: Patient has a normal mood and affect. behavior is normal. Judgment and thought content normal.   Recent Results (from the past 2160 hour(s))  POCT UA - Microalbumin     Status: None   Collection Time: 04/02/16 10:24 AM  Result Value Ref Range   Microalbumin Ur, POC 20 mg/L   Creatinine, POC  mg/dL    Albumin/Creatinine Ratio, Urine, POC    TSH     Status: None   Collection Time: 04/02/16 11:07 AM  Result Value Ref Range   TSH 0.81 mIU/L    Comment:   Reference Range   > or = 20 Years  0.40-4.50   Pregnancy Range First trimester  0.26-2.66 Second trimester 0.55-2.73 Third trimester  0.43-2.91     COMPLETE METABOLIC PANEL WITH GFR     Status: None   Collection Time: 04/02/16 11:07 AM  Result Value Ref Range   Sodium 139 135 - 146 mmol/L   Potassium 5.1 3.5 - 5.3 mmol/L   Chloride 104 98 - 110 mmol/L   CO2 27 20 - 31 mmol/L   Glucose, Bld 96 65 - 99 mg/dL   BUN 13 7 - 25 mg/dL   Creat 0.87 0.50 - 0.99 mg/dL    Comment:   For patients > or = 63 years of age: The upper reference limit for Creatinine is approximately 13% higher for people identified as African-American.      Total Bilirubin 0.9 0.2 - 1.2 mg/dL   Alkaline Phosphatase 99 33 - 130 U/L   AST 23 10 - 35 U/L   ALT 25 6 - 29 U/L   Total Protein 7.6 6.1 - 8.1 g/dL   Albumin 4.5 3.6 - 5.1 g/dL   Calcium 10.0 8.6 - 10.4 mg/dL   GFR, Est African American 82 >=60 mL/min   GFR, Est Non African American 71 >=60 mL/min  CBC with Differential/Platelet     Status: None   Collection Time: 04/02/16 11:07 AM  Result Value Ref Range   WBC 5.1 3.8 - 10.8 K/uL   RBC 4.68 3.80 - 5.10 MIL/uL   Hemoglobin 14.0 11.7 - 15.5 g/dL   HCT 41.1 35.0 - 45.0 %   MCV 87.8 80.0 - 100.0 fL   MCH 29.9 27.0 - 33.0 pg   MCHC 34.1 32.0 - 36.0 g/dL   RDW 13.4 11.0 - 15.0 %   Platelets 337 140 - 400 K/uL   MPV 8.7 7.5 - 12.5 fL   Neutro Abs 2,958 1,500 - 7,800 cells/uL   Lymphs Abs 1,632 850 - 3,900 cells/uL   Monocytes Absolute 408 200 - 950 cells/uL   Eosinophils Absolute 51 15 - 500 cells/uL   Basophils Absolute 51 0 - 200 cells/uL   Neutrophils Relative % 58 %   Lymphocytes Relative 32 %   Monocytes Relative 8 %   Eosinophils Relative 1 %   Basophils Relative 1 %   Smear Review Criteria for review not met   Lipid panel      Status: None   Collection Time: 04/02/16 11:07 AM  Result Value  Ref Range   Cholesterol 153 <200 mg/dL    Comment: ** Please note change in reference range(s). **      Triglycerides 51 <150 mg/dL    Comment: ** Please note change in reference range(s). **      HDL 72 >50 mg/dL    Comment: ** Please note change in reference range(s). **      Total CHOL/HDL Ratio 2.1 <5.0 Ratio   VLDL 10 <30 mg/dL   LDL Cholesterol 71 <100 mg/dL    Comment: ** Please note change in reference range(s). **     C-reactive protein     Status: None   Collection Time: 04/02/16 11:07 AM  Result Value Ref Range   CRP 0.5 <8.0 mg/L    PHQ2/9: Depression screen [facility] 2/9 04/02/2016 10/05/2015 03/13/2015  Decreased Interest 0 0 0  Down, Depressed, Hopeless 0 0 0  PHQ - 2 Score 0 0 0     Fall Risk: Fall Risk  04/02/2016 10/05/2015 03/13/2015 03/13/2015  Falls in the past year? Yes No Yes Yes  Number falls in past yr: 1 - 1 1  Injury with Fall? No - No No      Assessment & Plan  1. Uncontrolled hypertension  She never started HCTZ, we will change to Diovan-HCTZ, start with half pill for the first week and increase to one pill if tolerated.  - amlodipine-atorvastatin (CADUET) 10-20 MG tablet; Take 0.5 tablets by mouth 2 (two) times daily.  Dispense: 30 tablet; Refill: 5 - valsartan-hydrochlorothiazide (DIOVAN-HCT) 320-25 MG tablet; Take 1 tablet by mouth daily.  Dispense: 30 tablet; Refill: 0   2. Other chest pain  Discussed EKG and labs, but she refuses today.  She states she will see how it goes until next visit. Explained that females have atypical symptoms

## 2016-05-09 ENCOUNTER — Other Ambulatory Visit: Payer: Self-pay | Admitting: Family Medicine

## 2016-05-09 DIAGNOSIS — I10 Essential (primary) hypertension: Secondary | ICD-10-CM

## 2016-05-09 NOTE — Telephone Encounter (Signed)
Patient requesting refill of Lisinopril to [facility].  

## 2016-06-11 ENCOUNTER — Encounter: Payer: Self-pay | Admitting: Family Medicine

## 2016-06-11 ENCOUNTER — Ambulatory Visit (INDEPENDENT_AMBULATORY_CARE_PROVIDER_SITE_OTHER): Payer: BLUE CROSS/BLUE SHIELD | Admitting: Family Medicine

## 2016-06-11 VITALS — BP 136/78 | HR 92 | Temp 97.9°F | Resp 18 | Ht 63.0 in | Wt 137.9 lb

## 2016-06-11 DIAGNOSIS — B9789 Other viral agents as the cause of diseases classified elsewhere: Secondary | ICD-10-CM

## 2016-06-11 DIAGNOSIS — H938X3 Other specified disorders of ear, bilateral: Secondary | ICD-10-CM

## 2016-06-11 DIAGNOSIS — E785 Hyperlipidemia, unspecified: Secondary | ICD-10-CM | POA: Diagnosis not present

## 2016-06-11 DIAGNOSIS — J069 Acute upper respiratory infection, unspecified: Secondary | ICD-10-CM | POA: Diagnosis not present

## 2016-06-11 DIAGNOSIS — I10 Essential (primary) hypertension: Secondary | ICD-10-CM | POA: Diagnosis not present

## 2016-06-11 MED ORDER — FLUTICASONE PROPIONATE 50 MCG/ACT NA SUSP: 2.0000 | Freq: Every day | NASAL | 6 refills | Status: DC

## 2016-06-11 MED ORDER — VALSARTAN-HYDROCHLOROTHIAZIDE 320-25 MG PO TABS
1.0000 | ORAL_TABLET | Freq: Every day | ORAL | 4 refills | Status: DC
Start: 2016-06-11 — End: 2016-10-07

## 2016-06-11 NOTE — Progress Notes (Signed)
Name: Bianca Suarez   MRN: [id number]    DOB: 07-Jun-1950   Date:06/11/2016       Progress Note  Subjective  Chief Complaint  Chief Complaint  Patient presents with  . Hypertension    1 month F/U, Denies any symptoms-does not check BP at home  . Hyperlipidemia    Cramping in her feet    HPI  HTN: she has been compliant with medication, she denies chest pain or palpation, only side effects if mild orthostatic changes when she squats and stands up very quickly  Dyslipidemia: doing well on Caduet, reviewed labs with her, she has intermittent right foot cramping when she gets in the bed. Advised to stretch before going to bed. Reassurance and unlikely related to Caduet  URI: she was in [place] and returned from hot and humid to very cold weather. She had some right ear pain when she landed. Her cough has improved, but she has a lot of post-nasal drainage, intermittent cough from the drainage, ear fullness, she has facial pressure, no headache or fever.    Patient Active Problem List   Diagnosis Date Noted  . Dyslipidemia 12/24/2014  . Fibrocystic breast disease 12/24/2014  . 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 H/O transient cerebral ischemia 12/24/2014  . Benign hypertension 12/24/2014  . Lateral epicondylitis of left elbow 12/24/2014    Past Surgical History:  Procedure Laterality Date  . ABDOMINAL HYSTERECTOMY  [id number]  . COSMETIC SURGERY  2001   lens replacement  . EYE SURGERY      Family History  Problem Relation Age of Onset  . Diabetes Mother   . Hypertension Mother   . Hypertension Father   . CVA Father     Social History   Social History  . Marital status: Married    Spouse name: N/A  . Number of children: N/A  . Years of education: N/A   Occupational History  . Not on file.   Social History Main Topics  . Smoking status: Never Smoker  . Smokeless tobacco: Never Used  . Alcohol use No  .  Drug use: No  . Sexual activity: Yes    Partners: Male   Other Topics Concern  . Not on file   Social History Narrative  . No narrative on file     Current Outpatient Prescriptions:  .  amlodipine-atorvastatin (CADUET) 10-20 MG tablet, Take 0.5 tablets by mouth 2 (two) times daily., Disp: 30 tablet, Rfl: 5 .  aspirin 81 MG tablet, Take 1 tablet by mouth daily., Disp: , Rfl:  .  valsartan-hydrochlorothiazide (DIOVAN-HCT) 320-25 MG tablet, Take 1 tablet by mouth daily., Disp: 30 tablet, Rfl: 4  Allergies  Allergen Reactions  . Peanut Oil Diarrhea     ROS  Constitutional: Negative for fever or weight change.  Respiratory: Positive for occasional cough but no  shortness of breath.   Cardiovascular: Negative for chest pain or palpitations.  Gastrointestinal: Negative for abdominal pain, no bowel changes.  Musculoskeletal: Negative for gait problem or joint swelling.  Skin: Negative for rash.  Neurological: Positive for mild dizziness when she stands up, no  headache.  No other specific complaints in a complete review of systems (except as listed in HPI above).  Objective  Vitals:   06/11/16 1132  BP: 136/78  Pulse: 92  Resp: 18  Temp: 97.9 F (36.6 C)  TempSrc: Oral  SpO2: 97%  Weight: 137 lb 14.4 oz (62.6  kg)  Height: 5' 3" (1.6 m)    Body mass index is 24.43 kg/m.  Physical Exam  Constitutional: Patient appears well-developed and well-nourished.  No distress.  HEENT: head atraumatic, normocephalic, pupils equal and reactive to light, ears normal TM, neck supple, throat within normal limits, mild post-nasal drainage, no tenderness during percussion of sinus  Cardiovascular: Normal rate, regular rhythm and normal heart sounds.  No murmur heard. No BLE edema. Pulmonary/Chest: Effort normal and breath sounds normal. No respiratory distress. Abdominal: Soft.  There is no tenderness. Psychiatric: Patient has a normal mood and affect. behavior is normal. Judgment and  thought content normal.  Recent Results (from the past 2160 hour(s))  POCT UA - Microalbumin     Status: None   Collection Time: 04/02/16 10:24 AM  Result Value Ref Range   Microalbumin Ur, POC 20 mg/L   Creatinine, POC  mg/dL   Albumin/Creatinine Ratio, Urine, POC    TSH     Status: None   Collection Time: 04/02/16 11:07 AM  Result Value Ref Range   TSH 0.81 mIU/L    Comment:   Reference Range   > or = 20 Years  0.40-4.50   Pregnancy Range First trimester  0.26-2.66 Second trimester 0.55-2.73 Third trimester  0.43-2.91     COMPLETE METABOLIC PANEL WITH GFR     Status: None   Collection Time: 04/02/16 11:07 AM  Result Value Ref Range   Sodium 139 135 - 146 mmol/L   Potassium 5.1 3.5 - 5.3 mmol/L   Chloride 104 98 - 110 mmol/L   CO2 27 20 - 31 mmol/L   Glucose, Bld 96 65 - 99 mg/dL   BUN 13 7 - 25 mg/dL   Creat 0.87 0.50 - 0.99 mg/dL    Comment:   For patients > or = 64 years of age: The upper reference limit for Creatinine is approximately 13% higher for people identified as African-American.      Total Bilirubin 0.9 0.2 - 1.2 mg/dL   Alkaline Phosphatase 99 33 - 130 U/L   AST 23 10 - 35 U/L   ALT 25 6 - 29 U/L   Total Protein 7.6 6.1 - 8.1 g/dL   Albumin 4.5 3.6 - 5.1 g/dL   Calcium 10.0 8.6 - 10.4 mg/dL   GFR, Est African American 82 >=60 mL/min   GFR, Est Non African American 71 >=60 mL/min  CBC with Differential/Platelet     Status: None   Collection Time: 04/02/16 11:07 AM  Result Value Ref Range   WBC 5.1 3.8 - 10.8 K/uL   RBC 4.68 3.80 - 5.10 MIL/uL   Hemoglobin 14.0 11.7 - 15.5 g/dL   HCT 41.1 35.0 - 45.0 %   MCV 87.8 80.0 - 100.0 fL   MCH 29.9 27.0 - 33.0 pg   MCHC 34.1 32.0 - 36.0 g/dL   RDW 13.4 11.0 - 15.0 %   Platelets 337 140 - 400 K/uL   MPV 8.7 7.5 - 12.5 fL   Neutro Abs 2,958 1,500 - 7,800 cells/uL   Lymphs Abs 1,632 850 - 3,900 cells/uL   Monocytes Absolute 408 200 - 950 cells/uL   Eosinophils Absolute 51 15 - 500 cells/uL    Basophils Absolute 51 0 - 200 cells/uL   Neutrophils Relative % 58 %   Lymphocytes Relative 32 %   Monocytes Relative 8 %   Eosinophils Relative 1 %   Basophils Relative 1 %   Smear Review Criteria for review not  met   Lipid panel     Status: None   Collection Time: 04/02/16 11:07 AM  Result Value Ref Range   Cholesterol 153 <200 mg/dL    Comment: ** Please note change in reference range(s). **      Triglycerides 51 <150 mg/dL    Comment: ** Please note change in reference range(s). **      HDL 72 >50 mg/dL    Comment: ** Please note change in reference range(s). **      Total CHOL/HDL Ratio 2.1 <5.0 Ratio   VLDL 10 <30 mg/dL   LDL Cholesterol 71 <100 mg/dL    Comment: ** Please note change in reference range(s). **     C-reactive protein     Status: None   Collection Time: 04/02/16 11:07 AM  Result Value Ref Range   CRP 0.5 <8.0 mg/L     PHQ2/9: Depression screen [facility] 2/9 06/11/2016 04/02/2016 10/05/2015 03/13/2015  Decreased Interest 0 0 0 0  Down, Depressed, Hopeless 0 0 0 0  PHQ - 2 Score 0 0 0 0     Fall Risk: Fall Risk  06/11/2016 04/02/2016 10/05/2015 03/13/2015 03/13/2015  Falls in the past year? No Yes No Yes Yes  Number falls in past yr: - 1 - 1 1  Injury with Fall? - No - No No     Functional Status Survey: Is the patient deaf or have difficulty hearing?: No Does the patient have difficulty seeing, even when wearing glasses/contacts?: No Does the patient have difficulty concentrating, remembering, or making decisions?: No Does the patient have difficulty walking or climbing stairs?: No Does the patient have difficulty dressing or bathing?: No Does the patient have difficulty doing errands alone such as visiting a doctor's office or shopping?: No   Assessment & Plan  1. Benign hypertension  At goal now, continue medication - valsartan-hydrochlorothiazide (DIOVAN-HCT) 320-25 MG tablet; Take 1 tablet by mouth daily.  Dispense: 30 tablet; Refill:  4  2. Dyslipidemia  Continue DCaduet   3. Viral upper respiratory tract infection  Advised saline spray  4. Ear fullness, bilateral  - fluticasone (FLONASE) 50 MCG/ACT nasal spray; Place 2 sprays into both nostrils daily.  Dispense: 16 g; Refill: 6

## 2016-10-07 ENCOUNTER — Encounter: Payer: Self-pay | Admitting: Family Medicine

## 2016-10-07 ENCOUNTER — Ambulatory Visit (INDEPENDENT_AMBULATORY_CARE_PROVIDER_SITE_OTHER): Payer: BLUE CROSS/BLUE SHIELD | Admitting: Family Medicine

## 2016-10-07 VITALS — BP 128/82 | HR 71 | Temp 98.2°F | Resp 16 | Ht 62.0 in | Wt 140.2 lb

## 2016-10-07 DIAGNOSIS — Z0001 Encounter for general adult medical examination with abnormal findings: Secondary | ICD-10-CM | POA: Diagnosis not present

## 2016-10-07 DIAGNOSIS — Z1231 Encounter for screening mammogram for malignant neoplasm of breast: Secondary | ICD-10-CM | POA: Diagnosis not present

## 2016-10-07 DIAGNOSIS — Z01419 Encounter for gynecological examination (general) (routine) without abnormal findings: Secondary | ICD-10-CM

## 2016-10-07 DIAGNOSIS — I10 Essential (primary) hypertension: Secondary | ICD-10-CM

## 2016-10-07 DIAGNOSIS — Z1239 Encounter for other screening for malignant neoplasm of breast: Secondary | ICD-10-CM

## 2016-10-07 DIAGNOSIS — Z23 Encounter for immunization: Secondary | ICD-10-CM | POA: Diagnosis not present

## 2016-10-07 DIAGNOSIS — J302 Other seasonal allergic rhinitis: Secondary | ICD-10-CM

## 2016-10-07 DIAGNOSIS — E785 Hyperlipidemia, unspecified: Secondary | ICD-10-CM | POA: Diagnosis not present

## 2016-10-07 MED ORDER — AMLODIPINE-ATORVASTATIN 10-20 MG PO TABS: 0.5000 | ORAL_TABLET | Freq: Two times a day (BID) | ORAL | 1 refills | Status: DC

## 2016-10-07 MED ORDER — LORATADINE 10 MG PO TABS: 10.0000 mg | ORAL_TABLET | Freq: Every day | ORAL | 0 refills | Status: DC

## 2016-10-07 MED ORDER — MONTELUKAST SODIUM 10 MG PO TABS: 10.0000 mg | ORAL_TABLET | Freq: Every day | ORAL | 2 refills | Status: DC

## 2016-10-07 MED ORDER — VALSARTAN-HYDROCHLOROTHIAZIDE 320-25 MG PO TABS: 1.0000 | ORAL_TABLET | Freq: Every day | ORAL | 1 refills | Status: DC

## 2016-10-07 NOTE — Addendum Note (Signed)
Addended by: Steele Sizer F on: 10/07/2016 04:49 PM   Modules accepted: Level of Service

## 2016-10-07 NOTE — Patient Instructions (Signed)

## 2016-10-07 NOTE — Progress Notes (Signed)
Name: Bianca Suarez   MRN: [id number]    DOB: 20-Apr-1951   Date:10/07/2016       Progress Note  Subjective  Chief Complaint  Chief Complaint  Patient presents with  . Annual Exam  . Hyperlipidemia  . Hypertension    HPI  Well Woman: she had a hysterectomy, she denies any bladder problems, no change in bowel movements, no breast lumps.   HTN: she has been compliant with medication, she denies chest pain or palpation. BP is at goal  Dyslipidemia: doing well on Caduet,  she has intermittent right foot cramping when she gets in the bed. Advised to stretch before going to bed. Reassurance and unlikely related to Caduet. She states it may be a little less frequent now  Clearing throat: she states that for the past few months she has noticed that she needs to clear her throat, no burning sensation or regurgitation, no SOB or cough, she feels like she would feel better if she could cough it out. She denies nasal congestion, post-nasal drainage, itchy eyes or nose. Explained it may be allergies, but if no improvements call back for referral to ENT.    Patient Active Problem List   Diagnosis Date Noted  . Dyslipidemia 12/24/2014  . Fibrocystic breast disease 12/24/2014  . 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 H/O transient cerebral ischemia 12/24/2014  . Benign hypertension 12/24/2014  . Lateral epicondylitis of left elbow 12/24/2014    Past Surgical History:  Procedure Laterality Date  . ABDOMINAL HYSTERECTOMY  [id number]  . COSMETIC SURGERY  2001   lens replacement  . EYE SURGERY      Family History  Problem Relation Age of Onset  . Diabetes Mother   . Hypertension Mother   . Hypertension Father   . CVA Father     Social History   Social History  . Marital status: Married    Spouse name: N/A  . Number of children: N/A  . Years of education: N/A   Occupational History  . Not on file.   Social History Main  Topics  . Smoking status: Never Smoker  . Smokeless tobacco: Never Used  . Alcohol use No  . Drug use: No  . Sexual activity: Yes    Partners: Male   Other Topics Concern  . Not on file   Social History Narrative  . No narrative on file     Current Outpatient Prescriptions:  .  amlodipine-atorvastatin (CADUET) 10-20 MG tablet, Take 0.5 tablets by mouth 2 (two) times daily., Disp: 90 tablet, Rfl: 1 .  aspirin 81 MG tablet, Take 1 tablet by mouth daily., Disp: , Rfl:  .  fluticasone (FLONASE) 50 MCG/ACT nasal spray, Place 2 sprays into both nostrils daily., Disp: 16 g, Rfl: 6 .  valsartan-hydrochlorothiazide (DIOVAN-HCT) 320-25 MG tablet, Take 1 tablet by mouth daily., Disp: 90 tablet, Rfl: 1 .  loratadine (CLARITIN) 10 MG tablet, Take 1 tablet (10 mg total) by mouth daily., Disp: 30 tablet, Rfl: 0 .  montelukast (SINGULAIR) 10 MG tablet, Take 1 tablet (10 mg total) by mouth at bedtime., Disp: 30 tablet, Rfl: 2  Allergies  Allergen Reactions  . Peanut Oil Diarrhea     ROS  Constitutional: Negative for fever or weight change.  Respiratory: Negative for cough and shortness of breath., but has noticed that she needs to clear her throat   Cardiovascular: Negative for chest pain or palpitations.  Gastrointestinal: Negative for  abdominal pain, no bowel changes.  Musculoskeletal: Negative for gait problem or joint swelling.  Skin: Negative for rash.  Neurological: Negative for dizziness or headache.  No other specific complaints in a complete review of systems (except as listed in HPI above).  Objective  Vitals:   10/07/16 1127  BP: 128/82  Pulse: 71  Resp: 16  Temp: 98.2 F (36.8 C)  TempSrc: Oral  SpO2: 97%  Weight: 140 lb 3.2 oz (63.6 kg)  Height: 5\' 2"  (1.575 m)    Body mass index is 25.64 kg/m.  Physical Exam  Constitutional: Patient appears well-developed and well-nourished. No distress.  HENT: Head: Normocephalic and atraumatic. Ears: B TMs ok, no erythema  or effusion; Nose: Nose normal. Mouth/Throat: Oropharynx is clear and moist. No oropharyngeal exudate.  Eyes: Conjunctivae and EOM are normal. Pupils are equal, round, and reactive to light. No scleral icterus.  Neck: Normal range of motion. Neck supple. No JVD present. No thyromegaly present.  Cardiovascular: Normal rate, regular rhythm and normal heart sounds.  No murmur heard. No BLE edema. Pulmonary/Chest: Effort normal and breath sounds normal. No respiratory distress. Abdominal: Soft. Bowel sounds are normal, no distension. There is no tenderness. no masses Breast: no lumps or masses, no nipple discharge or rashes FEMALE GENITALIA:  Not done RECTAL: not done Musculoskeletal: Normal range of motion, no joint effusions. No gross deformities Neurological: he is alert and oriented to person, place, and time. No cranial nerve deficit. Coordination, balance, strength, speech and gait are normal.  Skin: Skin is warm and dry. No rash noted. No erythema.  Psychiatric: Patient has a normal mood and affect. behavior is normal. Judgment and thought content normal.  PHQ2/9: Depression screen [facility] 2/9 10/07/2016 06/11/2016 04/02/2016 10/05/2015 03/13/2015  Decreased Interest 0 0 0 0 0  Down, Depressed, Hopeless 0 0 0 0 0  PHQ - 2 Score 0 0 0 0 0     Fall Risk: Fall Risk  10/07/2016 06/11/2016 04/02/2016 10/05/2015 03/13/2015  Falls in the past year? No No Yes No Yes  Number falls in past yr: - - 1 - 1  Injury with Fall? - - No - No     Functional Status Survey: Is the patient deaf or have difficulty hearing?: No Does the patient have difficulty seeing, even when wearing glasses/contacts?: No Does the patient have difficulty concentrating, remembering, or making decisions?: No Does the patient have difficulty walking or climbing stairs?: No Does the patient have difficulty dressing or bathing?: No Does the patient have difficulty doing errands alone such as visiting a doctor's office or  shopping?: No  Current Exercise Habits: The patient does not participate in regular exercise at present      Assessment & Plan  1. Well woman exam  Discussed importance of 150 minutes of physical activity weekly, eat two servings of fish weekly, eat one serving of tree nuts ( cashews, pistachios, pecans, almonds.Marland Kitchen) every other day, eat 6 servings of fruit/vegetables daily and drink plenty of water and avoid sweet beverages.   2. Benign hypertension  - valsartan-hydrochlorothiazide (DIOVAN-HCT) 320-25 MG tablet; Take 1 tablet by mouth daily.  Dispense: 90 tablet; Refill: 1  3. Breast cancer screening  - MM Digital Screening; Future  4. Need for shingles vaccine  - Varicella-zoster vaccine IM  5. Dyslipidemia  - Caduet  6. Seasonal allergic rhinitis, unspecified trigger  - loratadine (CLARITIN) 10 MG tablet; Take 1 tablet (10 mg total) by mouth daily.  Dispense: 30 tablet; Refill: 0 -  montelukast (SINGULAIR) 10 MG tablet; Take 1 tablet (10 mg total) by mouth at bedtime.  Dispense: 30 tablet; Refill: 2

## 2016-12-09 ENCOUNTER — Ambulatory Visit: Payer: BLUE CROSS/BLUE SHIELD | Admitting: Family Medicine

## 2016-12-20 ENCOUNTER — Telehealth: Payer: Self-pay | Admitting: Family Medicine

## 2016-12-20 NOTE — Telephone Encounter (Signed)
Pt is taking valsartin and received a letter stating that it has been recalled. Please send new script sent to [facility].  Also pt is going on a cruise and would like to know if the motion sickness medication will affect her bp? Should she get the one OTC or would it be better if you prescribe it.

## 2016-12-22 ENCOUNTER — Other Ambulatory Visit: Payer: Self-pay | Admitting: Family Medicine

## 2016-12-22 MED ORDER — OLMESARTAN MEDOXOMIL-HCTZ 40-25 MG PO TABS: 1.0000 | ORAL_TABLET | Freq: Every day | ORAL | 1 refills | Status: DC

## 2016-12-22 MED ORDER — SCOPOLAMINE 1 MG/3DAYS TD PT72: 1.0000 | MEDICATED_PATCH | TRANSDERMAL | 0 refills | Status: DC

## 2016-12-22 MED ORDER — SCOPOLAMINE 1 MG/3DAYS TD PT72: 1.0000 | MEDICATED_PATCH | TRANSDERMAL | 12 refills | Status: DC

## 2016-12-22 NOTE — Telephone Encounter (Signed)
Changed to benicar and sent patch, most people do well just taking dramamin otc.

## 2016-12-23 NOTE — Telephone Encounter (Signed)
Patient notified about medication change and the motion sickness medication called in and about dramamin otc as well.

## 2017-04-09 ENCOUNTER — Ambulatory Visit: Payer: BLUE CROSS/BLUE SHIELD | Admitting: Family Medicine

## 2017-05-23 ENCOUNTER — Telehealth: Payer: Self-pay | Admitting: Family Medicine

## 2017-05-23 NOTE — Telephone Encounter (Signed)
Copied from [place] [id number]. Topic: Quick Communication - Rx Refill/Question >> May 23, 2017  9:07 AM Arletha Grippe wrote: Has the patient contacted their pharmacy? No.   (Agent: If no, request that the patient contact the pharmacy for the refill.)   Preferred Pharmacy (with phone number or street name): pt would like to know why she is still taking valsartan when it is linked to cancer and would like to change bp meds.  Pharm is [facility] on [address]. Cb# [phone]   Agent: Please be advised that RX refills may take up to 3 business days. We ask that you follow-up with your pharmacy.

## 2017-05-23 NOTE — Telephone Encounter (Signed)
The medication is not linked to cancer, some of the plants were contaminated, so only some batches were affected. She needs to contact her pharmacy to find out if her batch was affected.

## 2017-05-23 NOTE — Telephone Encounter (Signed)
Patient was notified and told to contact her pharmacy in regards to making sure none of her lot numbers were affected by this. Patient just was concerned due to the awareness they are advertising on TV and did not want to take cancerous medication.

## 2017-05-28 ENCOUNTER — Other Ambulatory Visit: Payer: Self-pay | Admitting: Family Medicine

## 2017-05-28 DIAGNOSIS — I10 Essential (primary) hypertension: Secondary | ICD-10-CM

## 2017-05-28 NOTE — Telephone Encounter (Signed)
Refill request for Hypertension medication:  Caduet 10-20 mg  Last office visit pertaining to hypertension: 10/07/2016  Follow up visit: None indicated  BP Readings from Last 3 Encounters:  10/07/16 128/82  06/11/16 136/78  05/06/16 (!) 158/98     Lab Results  Component Value Date   CREATININE 0.87 04/02/2016   BUN 13 04/02/2016   NA 139 04/02/2016   K 5.1 04/02/2016   CL 104 04/02/2016   CO2 27 04/02/2016

## 2017-05-29 NOTE — Telephone Encounter (Signed)
Please call and schedule patient appointment. She needs appointment for further refills.

## 2017-05-29 NOTE — Telephone Encounter (Signed)
LVM for pt to call the office to schedule an appt. °

## 2017-06-03 ENCOUNTER — Other Ambulatory Visit: Payer: Self-pay | Admitting: Family Medicine

## 2017-06-03 DIAGNOSIS — I10 Essential (primary) hypertension: Secondary | ICD-10-CM

## 2017-06-03 MED ORDER — AMLODIPINE-ATORVASTATIN 10-20 MG PO TABS: 0.5000 | ORAL_TABLET | Freq: Two times a day (BID) | ORAL | 0 refills | Status: DC

## 2017-06-03 NOTE — Telephone Encounter (Signed)
Copied from [place] [id number]. Topic: Quick Communication - Rx Refill/Question >> Jun 03, 2017  9:07 AM Lolita Rieger, RMA wrote: Medication: amlodipine   Has the patient contacted their pharmacy? yes  (Agent: If no, request that the patient contact the pharmacy for the refill.)   Preferred Pharmacy (with phone number or street name): [facility]   Agent: Please be advised that RX refills may take up to 3 business days. We ask that you follow-up with your pharmacy.

## 2017-06-03 NOTE — Telephone Encounter (Signed)
RX  REFILL  REQUEST AMLODAPINE

## 2017-06-20 ENCOUNTER — Ambulatory Visit: Payer: BLUE CROSS/BLUE SHIELD | Admitting: Family Medicine

## 2017-06-20 ENCOUNTER — Encounter: Payer: Self-pay | Admitting: Family Medicine

## 2017-06-20 VITALS — BP 160/100 | HR 86 | Resp 14 | Ht 62.0 in | Wt 141.9 lb

## 2017-06-20 DIAGNOSIS — Z1231 Encounter for screening mammogram for malignant neoplasm of breast: Secondary | ICD-10-CM

## 2017-06-20 DIAGNOSIS — I10 Essential (primary) hypertension: Secondary | ICD-10-CM | POA: Diagnosis not present

## 2017-06-20 DIAGNOSIS — J302 Other seasonal allergic rhinitis: Secondary | ICD-10-CM

## 2017-06-20 DIAGNOSIS — M8920 Other disorders of bone development and growth, unspecified site: Secondary | ICD-10-CM | POA: Diagnosis not present

## 2017-06-20 DIAGNOSIS — Z23 Encounter for immunization: Secondary | ICD-10-CM

## 2017-06-20 DIAGNOSIS — E785 Hyperlipidemia, unspecified: Secondary | ICD-10-CM

## 2017-06-20 DIAGNOSIS — M1711 Unilateral primary osteoarthritis, right knee: Secondary | ICD-10-CM | POA: Diagnosis not present

## 2017-06-20 LAB — COMPLETE METABOLIC PANEL WITH GFR
AG RATIO: 1.4 (calc) (ref 1.0–2.5)
ALBUMIN MSPROF: 4.5 g/dL (ref 3.6–5.1)
ALT: 33 U/L — ABNORMAL HIGH (ref 6–29)
AST: 22 U/L (ref 10–35)
Alkaline phosphatase (APISO): 113 U/L (ref 33–130)
BILIRUBIN TOTAL: 1.1 mg/dL (ref 0.2–1.2)
BUN: 12 mg/dL (ref 7–25)
CHLORIDE: 105 mmol/L (ref 98–110)
CO2: 27 mmol/L (ref 20–32)
Calcium: 9.8 mg/dL (ref 8.6–10.4)
Creat: 0.82 mg/dL (ref 0.50–0.99)
GFR, Est African American: 88 mL/min/{1.73_m2} (ref >=60)
GFR, Est Non African American: 76 mL/min/{1.73_m2} (ref >=60)
GLOBULIN: 3.3 g/dL (ref 1.9–3.7)
GLUCOSE: 100 mg/dL — AB (ref 65–99)
POTASSIUM: 3.9 mmol/L (ref 3.5–5.3)
SODIUM: 137 mmol/L (ref 135–146)
Total Protein: 7.8 g/dL (ref 6.1–8.1)

## 2017-06-20 LAB — LIPID PANEL
Cholesterol: 147 mg/dL (ref <200)
HDL: 67 mg/dL (ref >50)
LDL CHOLESTEROL (CALC): 66 mg/dL
Non-HDL Cholesterol (Calc): 80 mg/dL (calc) (ref <130)
Total CHOL/HDL Ratio: 2.2 (calc) (ref <5.0)
Triglycerides: 61 mg/dL (ref <150)

## 2017-06-20 MED ORDER — AMLODIPINE-ATORVASTATIN 10-20 MG PO TABS: 0.5000 | ORAL_TABLET | Freq: Two times a day (BID) | ORAL | 1 refills | Status: DC

## 2017-06-20 MED ORDER — OLMESARTAN MEDOXOMIL-HCTZ 40-25 MG PO TABS: 1.0000 | ORAL_TABLET | Freq: Every day | ORAL | 1 refills | Status: DC

## 2017-06-20 NOTE — Progress Notes (Signed)
Name: Bianca Suarez   MRN: [id number]    DOB: 1951-01-06   Date:06/20/2017       Progress Note  Subjective  Chief Complaint  Chief Complaint  Patient presents with  . Hypertension  . Medication Refill    HPI  Uncontrolled HTN: she stopped Valsartan hctz because of recall, I had sent benicar hctz but she states it was not dispensed by pharmacist. She denies chest pain or palpitation, no headaches or dizziness  Dyslipidemia: doing well on Caduet,  no cramping or muscle  Aches  Growth on dorsal aspect of both feet: she states it has been a gradual growth, hard and on top of both feet, right worse than left, going on for the past 6 months. Slightly tender around the growth, no redness or increase in warmth  OA right knee: she states she is doing better, no pain or swelling at this time.   Patient Active Problem List   Diagnosis Date Noted  . Dyslipidemia 12/24/2014  . Fibrocystic breast disease 12/24/2014  . 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 H/O transient cerebral ischemia 12/24/2014  . Benign hypertension 12/24/2014  . Lateral epicondylitis of left elbow 12/24/2014    Past Surgical History:  Procedure Laterality Date  . ABDOMINAL HYSTERECTOMY  [id number]  . COSMETIC SURGERY  2001   lens replacement  . EYE SURGERY      Family History  Problem Relation Age of Onset  . Diabetes Mother   . Hypertension Mother   . Hypertension Father   . CVA Father     Social History   Socioeconomic History  . Marital status: Married    Spouse name: Not on file  . Number of children: Not on file  . Years of education: Not on file  . Highest education level: Not on file  Social Needs  . Financial resource strain: Not on file  . Food insecurity - worry: Not on file  . Food insecurity - inability: Not on file  . Transportation needs - medical: Not on file  . Transportation needs - non-medical: Not on file  Occupational  History  . Not on file  Tobacco Use  . Smoking status: Never Smoker  . Smokeless tobacco: Never Used  Substance and Sexual Activity  . Alcohol use: No    Alcohol/week: 0.0 oz  . Drug use: No  . Sexual activity: Yes    Partners: Male  Other Topics Concern  . Not on file  Social History Narrative  . Not on file     Current Outpatient Medications:  .  amlodipine-atorvastatin (CADUET) 10-20 MG tablet, Take 0.5 tablets by mouth 2 (two) times daily., Disp: 90 tablet, Rfl: 1 .  aspirin 81 MG tablet, Take 1 tablet by mouth daily., Disp: , Rfl:  .  olmesartan-hydrochlorothiazide (BENICAR HCT) 40-25 MG tablet, Take 1 tablet by mouth daily., Disp: 90 tablet, Rfl: 1 .  fluticasone (FLONASE) 50 MCG/ACT nasal spray, Place 2 sprays into both nostrils daily. (Patient not taking: Reported on 06/20/2017), Disp: 16 g, Rfl: 6 .  loratadine (CLARITIN) 10 MG tablet, Take 1 tablet (10 mg total) by mouth daily. (Patient not taking: Reported on 06/20/2017), Disp: 30 tablet, Rfl: 0 .  montelukast (SINGULAIR) 10 MG tablet, Take 1 tablet (10 mg total) by mouth at bedtime. (Patient not taking: Reported on 06/20/2017), Disp: 30 tablet, Rfl: 2  Allergies  Allergen Reactions  . Peanut Oil Diarrhea     ROS  Constitutional: Negative for fever or weight change.  Respiratory: Negative for cough and shortness of breath.   Cardiovascular: Negative for chest pain or palpitations.  Gastrointestinal: Negative for abdominal pain, no bowel changes.  Musculoskeletal: Negative for gait problem or joint swelling.  Skin: Negative for rash.  Neurological: Negative for dizziness or headache.  No other specific complaints in a complete review of systems (except as listed in HPI above).  Objective  Vitals:   06/20/17 0934  BP: (!) 160/100  Pulse: 86  Resp: 14  SpO2: 99%  Weight: 141 lb 14.4 oz (64.4 kg)  Height: 5\' 2"  (1.575 m)    Body mass index is 25.95 kg/m.  Physical Exam  Constitutional: Patient appears  well-developed and well-nourished. Overweight.  No distress.  HEENT: head atraumatic, normocephalic, pupils equal and reactive to light,  neck supple, throat within normal limits Cardiovascular: Normal rate, regular rhythm and normal heart sounds.  No murmur heard. No BLE edema. Pulmonary/Chest: Effort normal and breath sounds normal. No respiratory distress. Abdominal: Soft.  There is no tenderness. Psychiatric: Patient has a normal mood and affect. behavior is normal. Judgment and thought content normal.  PHQ2/9: Depression screen [facility] 2/9 10/07/2016 06/11/2016 04/02/2016 10/05/2015 03/13/2015  Decreased Interest 0 0 0 0 0  Down, Depressed, Hopeless 0 0 0 0 0  PHQ - 2 Score 0 0 0 0 0     Fall Risk: Fall Risk  06/20/2017 10/07/2016 06/11/2016 04/02/2016 10/05/2015  Falls in the past year? No No No Yes No  Comment - - - - patient stated that sometimes she has some issues with her balance  Number falls in past yr: - - - 1 -  Injury with Fall? - - - No -    Functional Status Survey: Is the patient deaf or have difficulty hearing?: No Does the patient have difficulty seeing, even when wearing glasses/contacts?: No Does the patient have difficulty concentrating, remembering, or making decisions?: No Does the patient have difficulty walking or climbing stairs?: No Does the patient have difficulty dressing or bathing?: No Does the patient have difficulty doing errands alone such as visiting a doctor's office or shopping?: No   Assessment & Plan  1. Uncontrolled hypertension  - amlodipine-atorvastatin (CADUET) 10-20 MG tablet; Take 0.5 tablets by mouth 2 (two) times daily.  Dispense: 90 tablet; Refill: 1 - olmesartan-hydrochlorothiazide (BENICAR HCT) 40-25 MG tablet; Take 1 tablet by mouth daily.  Dispense: 90 tablet; Refill: 1 - COMPLETE METABOLIC PANEL WITH GFR  2. Primary osteoarthritis of right knee  stable  3. Dyslipidemia  - amlodipine-atorvastatin (CADUET) 10-20 MG tablet; Take  0.5 tablets by mouth 2 (two) times daily.  Dispense: 90 tablet; Refill: 1 - Lipid panel  4. Seasonal allergic rhinitis, unspecified trigger   5. Other disorders of bone development and growth, unspecified site  - Ambulatory referral to Podiatry  6. Encounter for screening mammogram for breast cancer  - MM DIGITAL SCREENING BILATERAL; Future  7. Need for shingles vaccine  - Varicella-zoster vaccine IM  8. Needs flu shot  - Flu vaccine HIGH DOSE PF

## 2017-07-08 ENCOUNTER — Other Ambulatory Visit: Payer: Self-pay | Admitting: Podiatry

## 2017-07-08 ENCOUNTER — Encounter: Payer: Self-pay | Admitting: Podiatry

## 2017-07-08 ENCOUNTER — Ambulatory Visit (INDEPENDENT_AMBULATORY_CARE_PROVIDER_SITE_OTHER): Payer: BLUE CROSS/BLUE SHIELD

## 2017-07-08 ENCOUNTER — Ambulatory Visit (INDEPENDENT_AMBULATORY_CARE_PROVIDER_SITE_OTHER): Payer: BLUE CROSS/BLUE SHIELD | Admitting: Podiatry

## 2017-07-08 DIAGNOSIS — M898X7 Other specified disorders of bone, ankle and foot: Secondary | ICD-10-CM | POA: Diagnosis not present

## 2017-07-08 DIAGNOSIS — R52 Pain, unspecified: Secondary | ICD-10-CM

## 2017-07-08 NOTE — Progress Notes (Signed)
   Subjective:    Patient ID: Bianca Suarez, female    DOB: 1950/10/13, 67 y.o.   MRN: [id number]  HPI    Review of Systems  All other systems reviewed and are negative.      Objective:   Physical Exam        Assessment & Plan:

## 2017-07-08 NOTE — Patient Instructions (Signed)
Pre-Operative Instructions  Congratulations, you have decided to take an important step towards improving your quality of life.  You can be assured that the doctors and staff at [facility] will be with you every step of the way.  Here are some important things you should know:  1. Plan to be at the surgery center/hospital at least 1 (one) hour prior to your scheduled time, unless otherwise directed by the surgical center/hospital staff.  You must have a responsible adult accompany you, remain during the surgery and drive you home.  Make sure you have directions to the surgical center/hospital to ensure you arrive on time. 2. If you are having surgery at [facility] or [facility] hospitals, you will need a copy of your medical history and physical form from your family physician within one month prior to the date of surgery. We will give you a form for your primary physician to complete.  3. We make every effort to accommodate the date you request for surgery.  However, there are times where surgery dates or times have to be moved.  We will contact you as soon as possible if a change in schedule is required.   4. No aspirin/ibuprofen for one week before surgery.  If you are on aspirin, any non-steroidal anti-inflammatory medications (Mobic, Aleve, Ibuprofen) should not be taken seven (7) days prior to your surgery.  You make take Tylenol for pain prior to surgery.  5. Medications - If you are taking daily heart and blood pressure medications, seizure, reflux, allergy, asthma, anxiety, pain or diabetes medications, make sure you notify the surgery center/hospital before the day of surgery so they can tell you which medications you should take or avoid the day of surgery. 6. No food or drink after midnight the night before surgery unless directed otherwise by surgical center/hospital staff. 7. No alcoholic beverages 24-hours prior to surgery.  No smoking 24-hours prior or 24-hours after  surgery. 8. Wear loose pants or shorts. They should be loose enough to fit over bandages, boots, and casts. 9. Don't wear slip-on shoes. Sneakers are preferred. 10. Bring your boot with you to the surgery center/hospital.  Also bring crutches or a walker if your physician has prescribed it for you.  If you do not have this equipment, it will be provided for you after surgery. 11. If you have not been contacted by the surgery center/hospital by the day before your surgery, call to confirm the date and time of your surgery. 12. Leave-time from work may vary depending on the type of surgery you have.  Appropriate arrangements should be made prior to surgery with your employer. 13. Prescriptions will be provided immediately following surgery by your doctor.  Fill these as soon as possible after surgery and take the medication as directed. Pain medications will not be refilled on weekends and must be approved by the doctor. 14. Remove nail polish on the operative foot and avoid getting pedicures prior to surgery. 15. Wash the night before surgery.  The night before surgery wash the foot and leg well with water and the antibacterial soap provided. Be sure to pay special attention to beneath the toenails and in between the toes.  Wash for at least three (3) minutes. Rinse thoroughly with water and dry well with a towel.  Perform this wash unless told not to do so by your physician.  Enclosed: 1 Ice pack (please put in freezer the night before surgery)   1 Hibiclens skin cleaner     Pre-op instructions  If you have any questions regarding the instructions, please do not hesitate to call our office.  [place]: [address], [place], [place] [address] -- [phone]  [place]: [address] , [place] [address] -- [phone]  [place]: [address]  [place], [place] [address] -- [phone]  [place]: [address], [address], [place], [place] [address] -- [phone]  Website:  [email or web address] 

## 2017-07-09 ENCOUNTER — Other Ambulatory Visit: Payer: Self-pay | Admitting: Family Medicine

## 2017-07-09 DIAGNOSIS — I10 Essential (primary) hypertension: Secondary | ICD-10-CM

## 2017-07-10 NOTE — Progress Notes (Signed)
   HPI: 67 year old female presenting today as a new patient with a chief complaint of nodules to the dorsum of bilateral feet that appeared about one year ago. She reports associated pressure to the nodules. She states the pressure is more intense on the right foot. Wearing shoes with straps intensifies the symptoms. She has not done anything for treatment. Patient is here for further evaluation and treatment.   Past Medical History:  Diagnosis Date  . Fibrocystic disease of left breast   . Glaucoma   . Hyperlipidemia   . Hypertension   . Lactose intolerance in adult   . Left lateral epicondylitis   . TIA (transient ischemic attack)      Physical Exam: General: The patient is alert and oriented x3 in no acute distress.  Dermatology: Skin is warm, dry and supple bilateral lower extremities. Negative for open lesions or macerations.  Vascular: Palpable pedal pulses bilaterally. No edema or erythema noted. Capillary refill within normal limits.  Neurological: Epicritic and protective threshold grossly intact bilaterally.   Musculoskeletal Exam: Large palpable exostosis noted with pain of palpation and with shoe gear bilateral feet. Range of motion within normal limits to all pedal and ankle joints bilateral. Muscle strength 5/5 in all groups bilateral.   Radiographic Exam:  1st Met/Cuneiform exostosis bilaterally.    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   Plan of Care:  - Patient evaluated. X-Rays reviewed.  - Today we discussed the conservative versus surgical management of the presenting pathology. The patient opts for surgical management. All possible complications and details of the procedure were explained. All patient questions were answered. No guarantees were expressed or implied. - Authorization for surgery was initiated today. Surgery will consist of exostectomy of the 1st metatarsal bilaterally. Exostectomy of the medial cuneiform bilaterally.  - Post  of shoes dispensed bilaterally.  - Return to clinic one week post op.    Edrick Kins, DPM [facility]  Dr. Edrick Kins, DPM    [address], [place] [address]                Office [phone]  Fax [phone]

## 2017-07-21 DIAGNOSIS — M722 Plantar fascial fibromatosis: Secondary | ICD-10-CM

## 2017-07-31 DIAGNOSIS — M7732 Calcaneal spur, left foot: Secondary | ICD-10-CM | POA: Diagnosis not present

## 2017-07-31 DIAGNOSIS — M7731 Calcaneal spur, right foot: Secondary | ICD-10-CM | POA: Diagnosis not present

## 2017-07-31 DIAGNOSIS — D1632 Benign neoplasm of short bones of left lower limb: Secondary | ICD-10-CM | POA: Diagnosis not present

## 2017-07-31 DIAGNOSIS — M85671 Other cyst of bone, right ankle and foot: Secondary | ICD-10-CM | POA: Diagnosis not present

## 2017-07-31 DIAGNOSIS — M86671 Other chronic osteomyelitis, right ankle and foot: Secondary | ICD-10-CM | POA: Diagnosis not present

## 2017-07-31 DIAGNOSIS — M85672 Other cyst of bone, left ankle and foot: Secondary | ICD-10-CM | POA: Diagnosis not present

## 2017-07-31 DIAGNOSIS — M86672 Other chronic osteomyelitis, left ankle and foot: Secondary | ICD-10-CM | POA: Diagnosis not present

## 2017-07-31 DIAGNOSIS — D1631 Benign neoplasm of short bones of right lower limb: Secondary | ICD-10-CM | POA: Diagnosis not present

## 2017-07-31 DIAGNOSIS — I10 Essential (primary) hypertension: Secondary | ICD-10-CM | POA: Diagnosis not present

## 2017-08-04 NOTE — Progress Notes (Signed)
DOS: 07/31/2017  Exostectomy first metatarsal bilateral. Exostectomy medial cuneiform bilateral

## 2017-08-06 ENCOUNTER — Ambulatory Visit (INDEPENDENT_AMBULATORY_CARE_PROVIDER_SITE_OTHER): Payer: BLUE CROSS/BLUE SHIELD | Admitting: Podiatry

## 2017-08-06 ENCOUNTER — Encounter: Payer: Self-pay | Admitting: Podiatry

## 2017-08-06 ENCOUNTER — Ambulatory Visit (INDEPENDENT_AMBULATORY_CARE_PROVIDER_SITE_OTHER): Payer: BLUE CROSS/BLUE SHIELD

## 2017-08-06 VITALS — BP 143/85 | HR 72 | Temp 98.4°F | Resp 16

## 2017-08-06 DIAGNOSIS — M898X7 Other specified disorders of bone, ankle and foot: Secondary | ICD-10-CM

## 2017-08-06 NOTE — Progress Notes (Signed)
She presents today as a postop visit #1 of Dr. Amalia Hailey.  Date of surgery July 31, 2017 dorsal tarsal exostectomy.  She states that she has not had the first pain at all.  States that she denies fever chills nausea vomiting muscle aches and pains.  Objective: Vital signs are stable alert and oriented x3 dry sterile dressing intact was removed demonstrates no erythema is mild edema no cellulitis drainage or odor Steri-Strips are intact Xeroform was intact and removed.  Radiographs demonstrate normal osseous architecture no acute findings.  Assessment: Well-healing surgical foot times 1 week.  Plan: We redressed her today with a dry sterile compressive dressing instructed her to continue conservative therapies keeping the foot dry and elevated bilaterally.  Follow-up with Dr. Amalia Hailey in 1 week

## 2017-08-15 ENCOUNTER — Ambulatory Visit (INDEPENDENT_AMBULATORY_CARE_PROVIDER_SITE_OTHER): Payer: BLUE CROSS/BLUE SHIELD | Admitting: Podiatry

## 2017-08-15 ENCOUNTER — Ambulatory Visit: Payer: BLUE CROSS/BLUE SHIELD

## 2017-08-15 ENCOUNTER — Encounter: Payer: Self-pay | Admitting: Podiatry

## 2017-08-15 VITALS — BP 126/79 | HR 72

## 2017-08-15 DIAGNOSIS — M898X7 Other specified disorders of bone, ankle and foot: Secondary | ICD-10-CM

## 2017-08-15 DIAGNOSIS — Z9889 Other specified postprocedural states: Secondary | ICD-10-CM

## 2017-08-20 NOTE — Progress Notes (Signed)
   Subjective:  Patient presents today status post tarsal exostectomy bilateral. DOS: 07/31/17. She states she is doing well. She reports that the incisions look well. She has no new complaints at this time. Patient is here for further evaluation and treatment.    Past Medical History:  Diagnosis Date  . Fibrocystic disease of left breast   . Glaucoma   . Hyperlipidemia   . Hypertension   . Lactose intolerance in adult   . Left lateral epicondylitis   . TIA (transient ischemic attack)       Objective/Physical Exam Neurovascular status intact.  Skin incisions appear to be well coapted. No sign of infectious process noted. No dehiscence. No active bleeding noted. Moderate edema noted to the surgical extremity.  Assessment: 1. s/p tarsal exostectomy bilateral. DOS: 07/31/17   Plan of Care:  1. Patient was evaluated.  2. Transition out of post op shoes into good sneakers.  3. Compression anklets dispensed bilaterally.  4. Return to clinic in 4 weeks.   Going to a marriage retreat in [place]. Married for 40 years.    Edrick Kins, DPM [facility]  Dr. Edrick Kins, [place]                                        [place], [place] [address]                Office [phone]  Fax [phone]

## 2017-09-12 ENCOUNTER — Ambulatory Visit (INDEPENDENT_AMBULATORY_CARE_PROVIDER_SITE_OTHER): Payer: BLUE CROSS/BLUE SHIELD | Admitting: Podiatry

## 2017-09-12 ENCOUNTER — Encounter: Payer: Self-pay | Admitting: Podiatry

## 2017-09-12 ENCOUNTER — Ambulatory Visit (INDEPENDENT_AMBULATORY_CARE_PROVIDER_SITE_OTHER): Payer: BLUE CROSS/BLUE SHIELD

## 2017-09-12 DIAGNOSIS — M898X7 Other specified disorders of bone, ankle and foot: Secondary | ICD-10-CM

## 2017-09-15 NOTE — Progress Notes (Signed)
   Subjective:  Patient presents today status post tarsal exostectomy bilateral. DOS: 07/31/17. She states she is doing well. She reports some numbness between the great and second toes of the left foot. She reports associated paresthesias of the left foot when she applies pressure to it. She states she cannot wear tennis shoes yet. Patient is here for further evaluation and treatment.    Past Medical History:  Diagnosis Date  . Fibrocystic disease of left breast   . Glaucoma   . Hyperlipidemia   . Hypertension   . Lactose intolerance in adult   . Left lateral epicondylitis   . TIA (transient ischemic attack)       Objective/Physical Exam Neurovascular status intact.  Skin incisions appear to be well coapted. No sign of infectious process noted. No dehiscence. No active bleeding noted. Moderate edema noted to the surgical extremity. Pain with palpation to the surgical site of the left foot.   Radiographic Exam:  Osteotomies sites appear to be stable with routine healing.  Assessment: 1. s/p tarsal exostectomy bilateral. DOS: 07/31/17   Plan of Care:  1. Patient was evaluated. X-Rays reviewed.  2. Injection of 0.5 mLs Celestone Soluspan injected into the left foot surgical site.  3. Recommended good shoe gear.  4. Return to clinic in 6 weeks.    Edrick Kins, DPM [facility]  Dr. Edrick Kins, [place]                                        [place], [place] [address]                Office [phone]  Fax [phone]

## 2017-09-29 HISTORY — PX: FOOT SURGERY: SHX648

## 2017-10-09 ENCOUNTER — Encounter: Payer: BLUE CROSS/BLUE SHIELD | Admitting: Family Medicine

## 2017-10-24 ENCOUNTER — Encounter: Payer: BLUE CROSS/BLUE SHIELD | Admitting: Podiatry

## 2017-10-27 NOTE — Progress Notes (Signed)
This encounter was created in error - please disregard.  This encounter was created in error - please disregard.

## 2017-12-29 ENCOUNTER — Other Ambulatory Visit: Payer: Self-pay | Admitting: Family Medicine

## 2017-12-29 DIAGNOSIS — I10 Essential (primary) hypertension: Secondary | ICD-10-CM

## 2017-12-29 DIAGNOSIS — E785 Hyperlipidemia, unspecified: Secondary | ICD-10-CM

## 2017-12-30 NOTE — Telephone Encounter (Signed)
Called [phone] and [phone] left message on both numbers asking pt to return call to schedule an appt for med refills

## 2017-12-30 NOTE — Telephone Encounter (Signed)
Hypertension medication request: Benicar to [place].   Last office visit pertaining to hypertension: 06/20/2017   BP Readings from Last 3 Encounters:  08/15/17 126/79  08/06/17 (!) 143/85  06/20/17 (!) 160/100    Lab Results  Component Value Date   CREATININE 0.82 06/20/2017   BUN 12 06/20/2017   NA 137 06/20/2017   K 3.9 06/20/2017   CL 105 06/20/2017   CO2 27 06/20/2017     No follow-ups on file.

## 2018-01-06 MED ORDER — AMLODIPINE-ATORVASTATIN 10-20 MG PO TABS: 0.5000 | ORAL_TABLET | Freq: Two times a day (BID) | ORAL | 0 refills | Status: DC

## 2018-01-06 MED ORDER — OLMESARTAN MEDOXOMIL-HCTZ 40-25 MG PO TABS: 1.0000 | ORAL_TABLET | Freq: Every day | ORAL | 0 refills | Status: DC

## 2018-01-06 NOTE — Telephone Encounter (Signed)
Patient scheduled CPE on 04/13/2018 at 11:00 and wants to know if her amlodipine-atorvastatin (CADUET) 10-20 MG tablet and olmesartan-hydrochlorothiazide (BENICAR HCT) 40-25 MG tablet will be called I now. If she still needs sooner appointment please call her back to let her know.

## 2018-01-06 NOTE — Addendum Note (Signed)
Addended by: Inda Coke on: 01/06/2018 11:58 AM   Modules accepted: Orders

## 2018-01-21 ENCOUNTER — Encounter: Payer: Self-pay | Admitting: Family Medicine

## 2018-01-21 ENCOUNTER — Ambulatory Visit (INDEPENDENT_AMBULATORY_CARE_PROVIDER_SITE_OTHER): Payer: BLUE CROSS/BLUE SHIELD | Admitting: Family Medicine

## 2018-01-21 VITALS — BP 138/92 | HR 78 | Temp 98.1°F | Resp 16 | Ht 62.0 in | Wt 145.6 lb

## 2018-01-21 DIAGNOSIS — R739 Hyperglycemia, unspecified: Secondary | ICD-10-CM

## 2018-01-21 DIAGNOSIS — E785 Hyperlipidemia, unspecified: Secondary | ICD-10-CM

## 2018-01-21 DIAGNOSIS — Z23 Encounter for immunization: Secondary | ICD-10-CM

## 2018-01-21 DIAGNOSIS — M1711 Unilateral primary osteoarthritis, right knee: Secondary | ICD-10-CM | POA: Diagnosis not present

## 2018-01-21 DIAGNOSIS — I10 Essential (primary) hypertension: Secondary | ICD-10-CM | POA: Diagnosis not present

## 2018-01-21 MED ORDER — AMLODIPINE-ATORVASTATIN 10-20 MG PO TABS: 1.0000 | ORAL_TABLET | Freq: Every day | ORAL | 0 refills | Status: DC

## 2018-01-21 MED ORDER — OLMESARTAN MEDOXOMIL-HCTZ 40-25 MG PO TABS: 1.0000 | ORAL_TABLET | Freq: Every day | ORAL | 0 refills | Status: DC

## 2018-01-21 NOTE — Progress Notes (Signed)
Name: Bianca Suarez   MRN: [id number]    DOB: 07-09-1950   Date:01/21/2018       Progress Note  Subjective  Chief Complaint  Chief Complaint  Patient presents with  . Medication Refill  . Hypertension  . Dyslipidemia  . OA right knee    Doing better with her knee pain    HPI   HTN: she stopped Valsartan hctz because of recall,she is on Benicar HCTZ 40/25 mg and Caduet 10/20, she has been taking half pill only, bp still a little high. She states taking half pill because she takes it at night. Advised to switch and take Caduet at night and Benicar hctz full pill in am and recheck with CMA sooner than follow up.   Dyslipidemia: doing well on Caduet, no cramping or muscle aches.   Foot surgery: by Dr. Amalia Hailey and is doing well, she missed last follow up and advised her to go back.   OA right knee: she states she is doing better, no pain but occasionally feels like it is puffy. It does not affect her ability to do anything, however very seldom she has instability, feels like it will give out. No falls secondary to that   Hyperglycemia: she denies polyphagia, polyuria or polydipsia    Patient Active Problem List   Diagnosis Date Noted  . Hyperglycemia 01/21/2018  . Dyslipidemia 12/24/2014  . Fibrocystic breast disease 12/24/2014  . 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 H/O transient cerebral ischemia 12/24/2014  . Benign hypertension 12/24/2014  . Lateral epicondylitis of left elbow 12/24/2014    Past Surgical History:  Procedure Laterality Date  . ABDOMINAL HYSTERECTOMY  05/20/2002  . COSMETIC SURGERY  2001   lens replacement  . EYE SURGERY    . FOOT SURGERY Bilateral 09/29/2017   Bone Spurs Cut Down    Family History  Problem Relation Age of Onset  . Diabetes Mother   . Hypertension Mother   . Hypertension Father   . CVA Father   . Hypertension Sister   . Stroke Maternal Grandmother   . Heart disease  Paternal Grandmother   . Diabetes Paternal Grandmother     Social History   Socioeconomic History  . Marital status: Married    Spouse name: Not on file  . Number of children: 3  . Years of education: Not on file  . Highest education level: Associate degree: academic program  Occupational History  . Not on file  Social Needs  . Financial resource strain: Not hard at all  . Food insecurity:    Worry: Never true    Inability: Never true  . Transportation needs:    Medical: No    Non-medical: No  Tobacco Use  . Smoking status: Never Smoker  . Smokeless tobacco: Never Used  Substance and Sexual Activity  . Alcohol use: No    Alcohol/week: 0.0 standard drinks  . Drug use: No  . Sexual activity: Yes    Partners: Male  Lifestyle  . Physical activity:    Days per week: 4 days    Minutes per session: 30 min  . Stress: Not at all  Relationships  . Social connections:    Talks on phone: More than three times a week    Gets together: More than three times a week    Attends religious service: More than 4 times per year    Active member of club or organization: Yes  Attends meetings of clubs or organizations: More than 4 times per year    Relationship status: Married  . Intimate partner violence:    Fear of current or ex partner: No    Emotionally abused: No    Physically abused: No    Forced sexual activity: No  Other Topics Concern  . Not on file  Social History Narrative  . Not on file     Current Outpatient Medications:  .  amlodipine-atorvastatin (CADUET) 10-20 MG tablet, Take 0.5 tablets by mouth 2 (two) times daily., Disp: 90 tablet, Rfl: 0 .  aspirin 81 MG tablet, Take 1 tablet by mouth daily., Disp: , Rfl:  .  olmesartan-hydrochlorothiazide (BENICAR HCT) 40-25 MG tablet, Take 1 tablet by mouth daily., Disp: 90 tablet, Rfl: 0  Allergies  Allergen Reactions  . Peanut Oil Diarrhea     ROS  Constitutional: Negative for fever , positive for mild  weight  change.  Respiratory: Negative for cough and shortness of breath.   Cardiovascular: Negative for chest pain or palpitations.  Gastrointestinal: Negative for abdominal pain, no bowel changes.  Musculoskeletal: Negative for gait problem or joint swelling.  Skin: Negative for rash.  Neurological: Negative for dizziness or headache.  No other specific complaints in a complete review of systems (except as listed in HPI above).  Objective  Vitals:   01/21/18 1305  BP: (!) 138/92  Pulse: 78  Resp: 16  Temp: 98.1 F (36.7 C)  TempSrc: Oral  SpO2: 99%  Weight: 145 lb 9.6 oz (66 kg)  Height: 5\' 2"  (1.575 m)    Body mass index is 26.63 kg/m.  Physical Exam  Constitutional: Patient appears well-developed and well-nourished. Obese No distress.  HEENT: head atraumatic, normocephalic, pupils equal and reactive to light, neck supple, throat within normal limits Cardiovascular: Normal rate, regular rhythm and normal heart sounds.  No murmur heard. No BLE edema. Pulmonary/Chest: Effort normal and breath sounds normal. No respiratory distress. Abdominal: Soft.  There is no tenderness. Psychiatric: Patient has a normal mood and affect. behavior is normal. Judgment and thought content normal.  PHQ2/9: Depression screen [facility] 2/9 01/21/2018 10/07/2016 06/11/2016 04/02/2016 10/05/2015  Decreased Interest 0 0 0 0 0  Down, Depressed, Hopeless 0 0 0 0 0  PHQ - 2 Score 0 0 0 0 0     Fall Risk: Fall Risk  01/21/2018 06/20/2017 10/07/2016 06/11/2016 04/02/2016  Falls in the past year? Yes No No No Yes  Comment - - - - -  Number falls in past yr: 1 - - - 1  Injury with Fall? Yes - - - No   Last week, she tripped   Functional Status Survey: Is the patient deaf or have difficulty hearing?: No Does the patient have difficulty seeing, even when wearing glasses/contacts?: Yes(glasses) Does the patient have difficulty concentrating, remembering, or making decisions?: No Does the patient have difficulty  walking or climbing stairs?: No Does the patient have difficulty dressing or bathing?: No    Assessment & Plan  1. Hypertension, benign  - amlodipine-atorvastatin (CADUET) 10-20 MG tablet; Take 1 tablet by mouth daily.  Dispense: 90 tablet; Refill: 0 - olmesartan-hydrochlorothiazide (BENICAR HCT) 40-25 MG tablet; Take 1 tablet by mouth daily.  Dispense: 90 tablet; Refill: 0  2. Need for immunization against influenza  refused  3. Primary osteoarthritis of right knee  Doing well at this time  4. Dyslipidemia  - amlodipine-atorvastatin (CADUET) 10-20 MG tablet; Take 1 tablet by mouth daily.  Dispense: 90  tablet; Refill: 0  5. Need for vaccination for pneumococcus  - Pneumococcal conjugate vaccine 13-valent IM  6. Hyperglycemia  We will recheckjlabs next visit

## 2018-02-24 ENCOUNTER — Ambulatory Visit (INDEPENDENT_AMBULATORY_CARE_PROVIDER_SITE_OTHER): Payer: BLUE CROSS/BLUE SHIELD | Admitting: Family Medicine

## 2018-02-24 ENCOUNTER — Encounter: Payer: Self-pay | Admitting: Family Medicine

## 2018-02-24 VITALS — BP 126/74 | HR 96 | Temp 98.4°F | Resp 14 | Ht 62.0 in | Wt 145.7 lb

## 2018-02-24 DIAGNOSIS — E2839 Other primary ovarian failure: Secondary | ICD-10-CM

## 2018-02-24 DIAGNOSIS — R739 Hyperglycemia, unspecified: Secondary | ICD-10-CM

## 2018-02-24 DIAGNOSIS — Z0000 Encounter for general adult medical examination without abnormal findings: Secondary | ICD-10-CM

## 2018-02-24 DIAGNOSIS — Z1231 Encounter for screening mammogram for malignant neoplasm of breast: Secondary | ICD-10-CM | POA: Diagnosis not present

## 2018-02-24 DIAGNOSIS — E785 Hyperlipidemia, unspecified: Secondary | ICD-10-CM | POA: Diagnosis not present

## 2018-02-24 DIAGNOSIS — I10 Essential (primary) hypertension: Secondary | ICD-10-CM

## 2018-02-24 DIAGNOSIS — Z23 Encounter for immunization: Secondary | ICD-10-CM

## 2018-02-24 MED ORDER — OLMESARTAN MEDOXOMIL-HCTZ 40-25 MG PO TABS: 1.0000 | ORAL_TABLET | Freq: Every day | ORAL | 0 refills | Status: DC

## 2018-02-24 MED ORDER — AMLODIPINE-ATORVASTATIN 10-20 MG PO TABS: 1.0000 | ORAL_TABLET | Freq: Every day | ORAL | 0 refills | Status: DC

## 2018-02-24 NOTE — Progress Notes (Signed)
Patient: Bianca Suarez, Female    DOB: 1950-07-18, 66 y.o.   MRN: [id number]  Visit Date: 02/24/2018  Today's Provider: Loistine Chance, MD   Chief Complaint  Patient presents with  . Medicare Wellness  . Immunizations    declines flu shot    Subjective:    HPI    Bianca Suarez is a 67 y.o. female who presents today for her Welcome to [facility]   Patient/Caregiver input:  No problems  HTN and hyperlipidemia: last visit bp was slightly up, however taking Caduet at night and Benicar in the day time , bp is really controlled today. No chest pain or palpitation. We will check labs and follow up in 6 months   Hyperglycemia: glucose was slightly elevated last time, she denies polyphagia, polydipsia or polyuria  Hyperlipidemia: recheck labs today  Review of Systems  Constitutional: Negative for fever or weight change.  Respiratory: Negative for cough and shortness of breath.   Cardiovascular: Negative for chest pain or palpitations.  Gastrointestinal: Negative for abdominal pain, no bowel changes.  Musculoskeletal: Negative for gait problem or joint swelling.  Skin: Negative for rash.  Neurological: Negative for dizziness or headache.  No other specific complaints in a complete review of systems (except as listed in HPI above).  Past Medical History:  Diagnosis Date  . Fibrocystic disease of left breast   . Glaucoma   . Hyperlipidemia   . Hypertension   . Lactose intolerance in adult   . Left lateral epicondylitis   . TIA (transient ischemic attack)     Past Surgical History:  Procedure Laterality Date  . ABDOMINAL HYSTERECTOMY  05/20/2002  . COSMETIC SURGERY  2001   lens replacement  . EYE SURGERY    . FOOT SURGERY Bilateral 09/29/2017   Bone Spurs Cut Down    Family History  Problem Relation Age of Onset  . Diabetes Mother   . Hypertension Mother   . Hypertension Father   . CVA Father   . Hypertension Sister   . Stroke Maternal Grandmother   . Heart  disease Paternal Grandmother   . Diabetes Paternal Grandmother     Social History   Socioeconomic History  . Marital status: Married    Spouse name: Belenda Cruise  . Number of children: 3  . Years of education: Not on file  . Highest education level: Associate degree: academic program  Occupational History  . Not on file  Social Needs  . Financial resource strain: Not hard at all  . Food insecurity:    Worry: Never true    Inability: Never true  . Transportation needs:    Medical: No    Non-medical: No  Tobacco Use  . Smoking status: Never Smoker  . Smokeless tobacco: Never Used  Substance and Sexual Activity  . Alcohol use: No    Alcohol/week: 0.0 standard drinks  . Drug use: No  . Sexual activity: Yes    Partners: Male    Birth control/protection: None  Lifestyle  . Physical activity:    Days per week: 4 days    Minutes per session: 30 min  . Stress: Not at all  Relationships  . Social connections:    Talks on phone: More than three times a week    Gets together: More than three times a week    Attends religious service: More than 4 times per year    Active member of club or organization: Yes    Attends meetings of clubs  or organizations: More than 4 times per year    Relationship status: Married  . Intimate partner violence:    Fear of current or ex partner: No    Emotionally abused: No    Physically abused: No    Forced sexual activity: No  Other Topics Concern  . Not on file  Social History Narrative  . Not on file    Outpatient Encounter Medications as of 02/24/2018  Medication Sig Note  . amlodipine-atorvastatin (CADUET) 10-20 MG tablet Take 1 tablet by mouth daily.   Marland Kitchen aspirin 81 MG tablet Take 1 tablet by mouth daily. 03/13/2015: DX: 401.9 Received from: [place]:   . olmesartan-hydrochlorothiazide (BENICAR HCT) 40-25 MG tablet Take 1 tablet by mouth daily.    No facility-administered encounter medications on file as of  02/24/2018.     Allergies  Allergen Reactions  . Peanut Oil Diarrhea    Care Team Updated in EHR: Yes  Last Vision Exam: April 2018  Wears corrective lenses: Yes - [place] Last Dental Exam: not currently, cannot find a dentist  Last Hearing Exam:Wears Hearing Aids: No  Functional Ability / Safety Screening 1.  Was the timed Get Up and Go test shorter than 30 seconds?  yes 2.  Does the patient need help with the phone, transportation, shopping,      preparing meals, housework, laundry, medications, or managing money?  no 3.  Is the patient's home free of loose throw rugs in walkways, pet beds, electrical cords, etc?   yes      Grab bars in the bathroom? no      Handrails on the stairs?   yes      Adequate lighting?   yes 4.  Has the patient noticed any hearing difficulties?   no  Diet Recall and Exercise Regimen:   No regular exercise routine, advised to try to walk 30 minutes 5 days a week  Diet: she skips one meal a day. She has a high calcium diet , she eats out daily , not eating enough fruit and vegetables  Advanced Care Planning: A voluntary discussion about advance care planning including the explanation and discussion of advance directives.  Discussed health care proxy and Living will, and the patient was able to identify a health care proxy as husband.  Patient does not have a living will at present time. If patient does have living will, I have requested they bring this to the clinic to be scanned in to their chart. Does patient have a HCPOA?    no If yes, name and contact information:  Does patient have a living will or MOST form?  no  Cancer Screenings:  Skin: discussed atypical lesion Lung:  Low Dose CT Chest recommended if Age 58-80 years, 30 pack-year currently smoking OR have quit w/in 15years. Patient does not qualify. Breast:  Up to date on Mammogram? No  Up to date of Bone Density/Dexa? No Colon: up to date  Additional Screenings:  Hepatitis  B/HIV/Syphillis: N/A Hepatitis C Screening: up to date  Intimate Partner Violence: negative screen   Objective:   Vitals: BP 126/74 (BP Location: Left Arm, Patient Position: Sitting, Cuff Size: Normal)   Pulse 96   Temp 98.4 F (36.9 C) (Oral)   Resp 14   Ht 5\' 2"  (1.575 m)   Wt 145 lb 11.2 oz (66.1 kg)   SpO2 96%   BMI 26.65 kg/m  Body mass index is 26.65 kg/m.  Physical Exam   Constitutional: Patient appears well-developed and well-nourished. Obese  No distress.  HEENT: head atraumatic, normocephalic, pupils equal and reactive to light, neck supple, throat within normal limits Cardiovascular: Normal rate, regular rhythm and normal heart sounds.  No murmur heard. No BLE edema. Pulmonary/Chest: Effort normal and breath sounds normal. No respiratory distress.  Abdominal: Soft.  There is no tenderness. Psychiatric: Patient has a normal mood and affect. behavior is normal. Judgment and thought content normal.   Cognitive Testing - 6-CIT  Correct? Score   What year is it? yes 0 Yes = 0    No = 4  What month is it? yes 0 Yes = 0    No = 3  Remember:     Pia [address]au, [place], [place]     What time is it? yes 0 Yes = 0    No = 3  Count backwards from 20 to 1 yes 0 Correct = 0    1 error = 2   More than 1 error = 4  Say the months of the year in reverse. yes 0 Correct = 0    1 error = 2   More than 1 error = 4  What address did I ask you to remember? yes 1 Correct = 0  1 error = 2    2 error = 4    3 error = 6    4 error = 8    All wrong = 10       TOTAL SCORE  1/28   Interpretation:  Normal  Normal (0-7) Abnormal (8-28)   Fall Risk: Fall Risk  02/24/2018 01/21/2018 06/20/2017 10/07/2016 06/11/2016  Falls in the past year? Yes Yes No No No  Comment - - - - -  Number falls in past yr: 1 1 - - -  Injury with Fall? Yes Yes - - -    Depression Screen Depression screen [facility] 2/9 02/24/2018 01/21/2018 10/07/2016 06/11/2016 04/02/2016  Decreased Interest 0 0 0 0 0  Down, Depressed,  Hopeless 0 0 0 0 0  PHQ - 2 Score 0 0 0 0 0  Altered sleeping 0 - - - -  Tired, decreased energy 0 - - - -  Change in appetite 0 - - - -  Feeling bad or failure about yourself  0 - - - -  Trouble concentrating 0 - - - -  Moving slowly or fidgety/restless 0 - - - -  Suicidal thoughts 0 - - - -  PHQ-9 Score 0 - - - -  Difficult doing work/chores Not difficult at all - - - -     Assessment & Plan:    1. Welcome to Medicare preventive visit  - DG Bone Density; Future - MM 3D SCREEN BREAST BILATERAL; [place] - hearing screen - vision screen   2. Encounter for screening mammogram for breast cancer  - MM 3D SCREEN BREAST BILATERAL; Future  3. Ovarian failure  - DG Bone Density; Future  4. Hyperglycemia  - Hemoglobin A1c  5. Hypertension, benign  - COMPLETE METABOLIC PANEL WITH GFR Continue medication , bp is back to goal   6. Dyslipidemia  - Lipid panel  7. Needs flu shot  refused  Exercise Activities and Dietary recommendations  She will try to walk daily  - Discussed health benefits of physical activity, and encouraged her to engage in regular exercise appropriate for her age and condition.   Immunization History  Administered  Date(s) Administered  . Pneumococcal Conjugate-13 01/21/2018  . Tdap 02/11/2011  . Zoster Recombinat (Shingrix) 10/07/2016, 06/20/2017    Health Maintenance  Topic Date Due  . MAMMOGRAM  04/06/2017  . DEXA SCAN  12/08/2017  . INFLUENZA VACCINE  12/18/2017  . HIV Screening  06/11/2019 (Originally 12/09/1965)  . PNA vac Low Risk Adult (2 of 2 - PPSV23) 01/22/2019  . TETANUS/TDAP  02/10/2021  . COLONOSCOPY  03/19/2021  . Hepatitis C Screening  Completed    No orders of the defined types were placed in this encounter.   Current Outpatient Medications:  .  amlodipine-atorvastatin (CADUET) 10-20 MG tablet, Take 1 tablet by mouth daily., Disp: 90 tablet, Rfl: 0 .  aspirin 81 MG tablet, Take 1 tablet by mouth daily., Disp: , Rfl:   .  olmesartan-hydrochlorothiazide (BENICAR HCT) 40-25 MG tablet, Take 1 tablet by mouth daily., Disp: 90 tablet, Rfl: 0 There are no discontinued medications.  I have personally reviewed and addressed the Medicare Annual Wellness health risk assessment questionnaire and have noted the following in the patient's chart:  A.         Medical and social history & family history B.         Use of alcohol, tobacco, and illicit drugs  C.         Current medications and supplements D.         Functional and Cognitive ability and status E.         Nutritional status F.         Physical activity G.        Advance directives H.         List of other physicians I.          Hospitalizations, surgeries, and ER visits in previous 12 months J.         Olean such as hearing, vision, cognitive function, and depression L.         Referrals and appointments: mammogram and bone density   In addition, I have reviewed and discussed with patient certain preventive protocols, quality metrics, and best practice recommendations. A written personalized care plan for preventive services as well as general preventive health recommendations were provided to patient.   See attached scanned questionnaire for additional information.   6 months follow up

## 2018-02-24 NOTE — Patient Instructions (Signed)
Preventive Care 65 Years and Older, Female Preventive care refers to lifestyle choices and visits with your health care provider that can promote health and wellness. What does preventive care include?  A yearly physical exam. This is also called an annual well check.  Dental exams once or twice a year.  Routine eye exams. Ask your health care provider how often you should have your eyes checked.  Personal lifestyle choices, including: ? Daily care of your teeth and gums. ? Regular physical activity. ? Eating a healthy diet. ? Avoiding tobacco and drug use. ? Limiting alcohol use. ? Practicing safe sex. ? Taking low-dose aspirin every day. ? Taking vitamin and mineral supplements as recommended by your health care provider. What happens during an annual well check? The services and screenings done by your health care provider during your annual well check will depend on your age, overall health, lifestyle risk factors, and family history of disease. Counseling Your health care provider may ask you questions about your:  Alcohol use.  Tobacco use.  Drug use.  Emotional well-being.  Home and relationship well-being.  Sexual activity.  Eating habits.  History of falls.  Memory and ability to understand (cognition).  Work and work environment.  Reproductive health.  Screening You may have the following tests or measurements:  Height, weight, and BMI.  Blood pressure.  Lipid and cholesterol levels. These may be checked every 5 years, or more frequently if you are over 50 years old.  Skin check.  Lung cancer screening. You may have this screening every year starting at age 55 if you have a 30-pack-year history of smoking and currently smoke or have quit within the past 15 years.  Fecal occult blood test (FOBT) of the stool. You may have this test every year starting at age 50.  Flexible sigmoidoscopy or colonoscopy. You may have a sigmoidoscopy every 5 years or  a colonoscopy every 10 years starting at age 50.  Hepatitis C blood test.  Hepatitis B blood test.  Sexually transmitted disease (STD) testing.  Diabetes screening. This is done by checking your blood sugar (glucose) after you have not eaten for a while (fasting). You may have this done every 1-3 years.  Bone density scan. This is done to screen for osteoporosis. You may have this done starting at age 65.  Mammogram. This may be done every 1-2 years. Talk to your health care provider about how often you should have regular mammograms.  Talk with your health care provider about your test results, treatment options, and if necessary, the need for more tests. Vaccines Your health care provider may recommend certain vaccines, such as:  Influenza vaccine. This is recommended every year.  Tetanus, diphtheria, and acellular pertussis (Tdap, Td) vaccine. You may need a Td booster every 10 years.  Varicella vaccine. You may need this if you have not been vaccinated.  Zoster vaccine. You may need this after age 60.  Measles, mumps, and rubella (MMR) vaccine. You may need at least one dose of MMR if you were born in 1957 or later. You may also need a second dose.  Pneumococcal 13-valent conjugate (PCV13) vaccine. One dose is recommended after age 65.  Pneumococcal polysaccharide (PPSV23) vaccine. One dose is recommended after age 65.  Meningococcal vaccine. You may need this if you have certain conditions.  Hepatitis A vaccine. You may need this if you have certain conditions or if you travel or work in places where you may be exposed to hepatitis   A.  Hepatitis B vaccine. You may need this if you have certain conditions or if you travel or work in places where you may be exposed to hepatitis B.  Haemophilus influenzae type b (Hib) vaccine. You may need this if you have certain conditions.  Talk to your health care provider about which screenings and vaccines you need and how often you  need them. This information is not intended to replace advice given to you by your health care provider. Make sure you discuss any questions you have with your health care provider. Document Released: 06/02/2015 Document Revised: 01/24/2016 Document Reviewed: 03/07/2015 Elsevier Interactive Patient Education  2018 [employer]  

## 2018-02-25 LAB — COMPLETE METABOLIC PANEL WITH GFR
AG RATIO: 1.3 (calc) (ref 1.0–2.5)
ALT: 25 U/L (ref 6–29)
AST: 23 U/L (ref 10–35)
Albumin: 4.4 g/dL (ref 3.6–5.1)
Alkaline phosphatase (APISO): 102 U/L (ref 33–130)
BUN/Creatinine Ratio: 19 (calc) (ref 6–22)
BUN: 21 mg/dL (ref 7–25)
CALCIUM: 9.8 mg/dL (ref 8.6–10.4)
CO2: 28 mmol/L (ref 20–32)
Chloride: 101 mmol/L (ref 98–110)
Creat: 1.11 mg/dL — ABNORMAL HIGH (ref 0.50–0.99)
GFR, EST AFRICAN AMERICAN: 60 mL/min/{1.73_m2} (ref >=60)
GFR, EST NON AFRICAN AMERICAN: 52 mL/min/{1.73_m2} — AB (ref >=60)
Globulin: 3.3 g/dL (calc) (ref 1.9–3.7)
Glucose, Bld: 88 mg/dL (ref 65–139)
Potassium: 3.9 mmol/L (ref 3.5–5.3)
Sodium: 137 mmol/L (ref 135–146)
Total Bilirubin: 0.7 mg/dL (ref 0.2–1.2)
Total Protein: 7.7 g/dL (ref 6.1–8.1)

## 2018-02-25 LAB — HEMOGLOBIN A1C
HEMOGLOBIN A1C: 6.3 %{Hb} — AB (ref <5.7)
MEAN PLASMA GLUCOSE: 134 (calc)
eAG (mmol/L): 7.4 (calc)

## 2018-02-25 LAB — LIPID PANEL
Cholesterol: 149 mg/dL (ref <200)
HDL: 55 mg/dL (ref >50)
LDL Cholesterol (Calc): 75 mg/dL (calc)
NON-HDL CHOLESTEROL (CALC): 94 mg/dL (ref <130)
Total CHOL/HDL Ratio: 2.7 (calc) (ref <5.0)
Triglycerides: 107 mg/dL (ref <150)

## 2018-04-13 ENCOUNTER — Encounter: Payer: BLUE CROSS/BLUE SHIELD | Admitting: Family Medicine

## 2018-07-22 ENCOUNTER — Ambulatory Visit (INDEPENDENT_AMBULATORY_CARE_PROVIDER_SITE_OTHER): Payer: BLUE CROSS/BLUE SHIELD | Admitting: Family Medicine

## 2018-07-22 ENCOUNTER — Encounter: Payer: Self-pay | Admitting: Family Medicine

## 2018-07-22 VITALS — BP 132/74 | HR 76 | Temp 97.6°F | Resp 16 | Ht 62.0 in | Wt 144.0 lb

## 2018-07-22 DIAGNOSIS — R7303 Prediabetes: Secondary | ICD-10-CM | POA: Diagnosis not present

## 2018-07-22 DIAGNOSIS — E785 Hyperlipidemia, unspecified: Secondary | ICD-10-CM

## 2018-07-22 DIAGNOSIS — M1711 Unilateral primary osteoarthritis, right knee: Secondary | ICD-10-CM | POA: Diagnosis not present

## 2018-07-22 DIAGNOSIS — I10 Essential (primary) hypertension: Secondary | ICD-10-CM | POA: Diagnosis not present

## 2018-07-22 MED ORDER — AMLODIPINE-ATORVASTATIN 10-20 MG PO TABS: 1.0000 | ORAL_TABLET | Freq: Every day | ORAL | 1 refills | Status: DC

## 2018-07-22 MED ORDER — OLMESARTAN MEDOXOMIL-HCTZ 40-25 MG PO TABS: 1.0000 | ORAL_TABLET | Freq: Every day | ORAL | 1 refills | Status: DC

## 2018-07-22 NOTE — Progress Notes (Signed)
Name: Bianca Suarez   MRN: [id number]    DOB: February 20, 1951   Date:07/22/2018       Progress Note  Subjective  Chief Complaint  Chief Complaint  Patient presents with  . Medication Refill  . Hypertension    Denies any symptoms  . OA right knee    Certain moving makes it worst-squats and bending  . Hyperglycemia  . Dyslipidemia    HPI   HTN and hyperlipidemia: taking medication as prescribed, she denies side effects. No chest pain or palpitation. BP is at goal. She has occasional cramping on her feet, only when she is holding her foot straight out, advised to stretch and also drinking more water   Pre-diabetes: last hbgA1C 6.4% , she denies polyphagia, polydipsia or polyuria. Discussed importance of life style modification.   Hyperlipidemia: reviewed labs with patient LDL is at goal 75, HDL also at goal at 55. Normal triglycerides   OA: she states right knee has intermittent effusion ( symptoms are intermittent for years) she states she was painting her kitchen and going up and down the ladder often and noticed recurrence of effusion. No redness or increase in warmth. No significant pain. She took Advil once and symptoms are improving.   Patient Active Problem List   Diagnosis Date Noted  . Hyperglycemia 01/21/2018  . Dyslipidemia 12/24/2014  . Fibrocystic breast disease 12/24/2014  . 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 H/O transient cerebral ischemia 12/24/2014  . Benign hypertension 12/24/2014  . Lateral epicondylitis of left elbow 12/24/2014    Past Surgical History:  Procedure Laterality Date  . ABDOMINAL HYSTERECTOMY  05/20/2002  . COSMETIC SURGERY  2001   lens replacement  . EYE SURGERY    . FOOT SURGERY Bilateral 09/29/2017   Bone Spurs Cut Down    Family History  Problem Relation Age of Onset  . Diabetes Mother   . Hypertension Mother   . Hypertension Father   . CVA Father   . Hypertension Sister    . Stroke Maternal Grandmother   . Heart disease Paternal Grandmother   . Diabetes Paternal Grandmother     Social History   Socioeconomic History  . Marital status: Married    Spouse name: Belenda Cruise  . Number of children: 3  . Years of education: Not on file  . Highest education level: Associate degree: academic program  Occupational History  . Not on file  Social Needs  . Financial resource strain: Not hard at all  . Food insecurity:    Worry: Never true    Inability: Never true  . Transportation needs:    Medical: No    Non-medical: No  Tobacco Use  . Smoking status: Never Smoker  . Smokeless tobacco: Never Used  Substance and Sexual Activity  . Alcohol use: No    Alcohol/week: 0.0 standard drinks  . Drug use: No  . Sexual activity: Yes    Partners: Male    Birth control/protection: None    Comment: Hysterectomy-Total  Lifestyle  . Physical activity:    Days per week: 0 days    Minutes per session: 0 min  . Stress: Not at all  Relationships  . Social connections:    Talks on phone: More than three times a week    Gets together: More than three times a week    Attends religious service: More than 4 times per year    Active member of club or organization:  Yes    Attends meetings of clubs or organizations: More than 4 times per year    Relationship status: Married  . Intimate partner violence:    Fear of current or ex partner: No    Emotionally abused: No    Physically abused: No    Forced sexual activity: No  Other Topics Concern  . Not on file  Social History Narrative  . Not on file     Current Outpatient Medications:  .  amlodipine-atorvastatin (CADUET) 10-20 MG tablet, Take 1 tablet by mouth daily., Disp: 90 tablet, Rfl: 0 .  aspirin 81 MG tablet, Take 1 tablet by mouth daily., Disp: , Rfl:  .  olmesartan-hydrochlorothiazide (BENICAR HCT) 40-25 MG tablet, Take 1 tablet by mouth daily., Disp: 90 tablet, Rfl: 0  Allergies  Allergen Reactions  .  Peanut Oil Diarrhea    I personally reviewed active problem list, medication list, allergies, family history, social history with the patient/caregiver today.   ROS  Constitutional: Negative for fever or weight change.  Respiratory: Negative for cough and shortness of breath.   Cardiovascular: Negative for chest pain or palpitations.  Gastrointestinal: Negative for abdominal pain, no bowel changes.  Musculoskeletal: Negative for gait problem or joint swelling.  Skin: Negative for rash.  Neurological: Negative for dizziness or headache.  No other specific complaints in a complete review of systems (except as listed in HPI above).  Objective  Vitals:   07/22/18 0917  BP: 132/74  Pulse: 76  Resp: 16  Temp: 97.6 F (36.4 C)  TempSrc: Oral  SpO2: 99%  Weight: 144 lb (65.3 kg)  Height: 5\' 2"  (1.575 m)    Body mass index is 26.34 kg/m.  Physical Exam  Constitutional: Patient appears well-developed and well-nourished. Overweight.  No distress.  HEENT: head atraumatic, normocephalic, pupils equal and reactive to light,  neck supple, throat within normal limits Cardiovascular: Normal rate, regular rhythm and normal heart sounds.  No murmur heard. No BLE edema. Pulmonary/Chest: Effort normal and breath sounds normal. No respiratory distress. Abdominal: Soft.  There is no tenderness. Muscular Skeletal: small effusion, no redness or increase in warmth, mild crepitus of right knee  Psychiatric: Patient has a normal mood and affect. behavior is normal. Judgment and thought content normal.   PHQ2/9: Depression screen [facility] 2/9 07/22/2018 02/24/2018 01/21/2018 10/07/2016 06/11/2016  Decreased Interest 0 0 0 0 0  Down, Depressed, Hopeless 0 0 0 0 0  PHQ - 2 Score 0 0 0 0 0  Altered sleeping 0 0 - - -  Tired, decreased energy 0 0 - - -  Change in appetite 0 0 - - -  Feeling bad or failure about yourself  0 0 - - -  Trouble concentrating 0 0 - - -  Moving slowly or fidgety/restless 0 0 - -  -  Suicidal thoughts 0 0 - - -  PHQ-9 Score 0 0 - - -  Difficult doing work/chores Not difficult at all Not difficult at all - - -     Fall Risk: Fall Risk  07/22/2018 02/24/2018 01/21/2018 06/20/2017 10/07/2016  Falls in the past year? 0 Yes Yes No No  Comment - - - - -  Number falls in past yr: - 1 1 - -  Injury with Fall? - Yes Yes - -     Functional Status Survey: Is the patient deaf or have difficulty hearing?: No Does the patient have difficulty seeing, even when wearing glasses/contacts?: Yes Does the patient have  difficulty concentrating, remembering, or making decisions?: No Does the patient have difficulty walking or climbing stairs?: No Does the patient have difficulty dressing or bathing?: No Does the patient have difficulty doing errands alone such as visiting a doctor's office or shopping?: No    Assessment & Plan  1. Dyslipidemia  - amlodipine-atorvastatin (CADUET) 10-20 MG tablet; Take 1 tablet by mouth daily.  Dispense: 90 tablet; Refill: 1  2. Hypertension, benign  - amlodipine-atorvastatin (CADUET) 10-20 MG tablet; Take 1 tablet by mouth daily.  Dispense: 90 tablet; Refill: 1 - olmesartan-hydrochlorothiazide (BENICAR HCT) 40-25 MG tablet; Take 1 tablet by mouth daily.  Dispense: 90 tablet; Refill: 1  3. Pre-diabetes  Discussed options such as starting Metformin versus life style modifications. She will try decrease carbohydrate intake including less sugar on her coffee and consider stevia.    4. Primary osteoarthritis of right knee  Tylenol for pain, if effusion take Aleve for a couple of days and come in if needed.

## 2018-08-26 ENCOUNTER — Ambulatory Visit: Payer: BLUE CROSS/BLUE SHIELD | Admitting: Family Medicine

## 2019-01-19 ENCOUNTER — Other Ambulatory Visit: Payer: Self-pay | Admitting: Family Medicine

## 2019-01-19 DIAGNOSIS — I10 Essential (primary) hypertension: Secondary | ICD-10-CM

## 2019-01-19 DIAGNOSIS — E785 Hyperlipidemia, unspecified: Secondary | ICD-10-CM

## 2019-01-22 ENCOUNTER — Ambulatory Visit: Payer: BLUE CROSS/BLUE SHIELD | Admitting: Family Medicine

## 2019-02-02 ENCOUNTER — Ambulatory Visit (INDEPENDENT_AMBULATORY_CARE_PROVIDER_SITE_OTHER): Payer: Medicare Other | Admitting: Family Medicine

## 2019-02-02 ENCOUNTER — Other Ambulatory Visit: Payer: Self-pay

## 2019-02-02 ENCOUNTER — Encounter: Payer: Self-pay | Admitting: Family Medicine

## 2019-02-02 DIAGNOSIS — R739 Hyperglycemia, unspecified: Secondary | ICD-10-CM

## 2019-02-02 DIAGNOSIS — I10 Essential (primary) hypertension: Secondary | ICD-10-CM

## 2019-02-02 DIAGNOSIS — E785 Hyperlipidemia, unspecified: Secondary | ICD-10-CM

## 2019-02-02 DIAGNOSIS — R7303 Prediabetes: Secondary | ICD-10-CM

## 2019-02-02 DIAGNOSIS — M1711 Unilateral primary osteoarthritis, right knee: Secondary | ICD-10-CM | POA: Diagnosis not present

## 2019-02-02 MED ORDER — OLMESARTAN MEDOXOMIL-HCTZ 40-25 MG PO TABS: 1.0000 | ORAL_TABLET | Freq: Every day | ORAL | 1 refills | Status: DC

## 2019-02-02 MED ORDER — AMLODIPINE-ATORVASTATIN 10-20 MG PO TABS: 1.0000 | ORAL_TABLET | Freq: Every day | ORAL | 1 refills | Status: DC

## 2019-02-02 NOTE — Progress Notes (Signed)
Name: Bianca Suarez   MRN: [id number]    DOB: 12-02-1950   Date:02/02/2019       Progress Note  Subjective  Chief Complaint  Chief Complaint  Patient presents with  . Hypertension  . Hyperglycemia  . Dyslipidemia  . Prediabetes    I connected with  Bianca Suarez  on 02/02/19 at  9:40 AM EDT by a video enabled telemedicine application and verified that I am speaking with the correct person using two identifiers.  I discussed the limitations of evaluation and management by telemedicine and the availability of in person appointments. The patient expressed understanding and agreed to proceed. Staff also discussed with the patient that there may be a patient responsible charge related to this service. Patient Location: at home  Provider Location: [facility]    HPI  HTNand hyperlipidemia: taking medication as prescribed, she denies side effects. No chest pain or palpitation. BP was checked one month ago when applying for another life insurance and it was normal. She states feet cramping has improved   Pre-diabetes: last hbgA1C 6.4% , she denies polyphagia, polydipsia or polyuria. Discussed importance of life style modification. She has been trying to avoid sweets  Hyperlipidemia: reviewed labs with patient LDL is at goal 75, HDL also at goal at 55. Normal triglycerides , she is on Caduet no side effects  OA: she states right knee has intermittent effusion ( symptoms are intermittent for years) she states she purchased new shoes for walking, trying to not overdue  No redness or effusion at this time  Explained importance of pneumonia 23, flu vaccines and also to have mammogram and bone density done as discussed last year.   Patient Active Problem List   Diagnosis Date Noted  . Pre-diabetes 07/22/2018  . Hyperglycemia 01/21/2018  . Dyslipidemia 12/24/2014  . Fibrocystic breast disease 12/24/2014  . 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 Glaucoma associated with  ocular inflammation 12/24/2014  . H/O transient cerebral ischemia 12/24/2014  . Benign hypertension 12/24/2014  . Lateral epicondylitis of left elbow 12/24/2014    Past Surgical History:  Procedure Laterality Date  . ABDOMINAL HYSTERECTOMY  05/20/2002  . COSMETIC SURGERY  2001   lens replacement  . EYE SURGERY    . FOOT SURGERY Bilateral 09/29/2017   Bone Spurs Cut Down    Family History  Problem Relation Age of Onset  . Diabetes Mother   . Hypertension Mother   . Hypertension Father   . CVA Father   . Hypertension Sister   . Stroke Maternal Grandmother   . Heart disease Paternal Grandmother   . Diabetes Paternal Grandmother     Social History   Socioeconomic History  . Marital status: Married    Spouse name: Belenda Cruise  . Number of children: 3  . Years of education: Not on file  . Highest education level: Associate degree: academic program  Occupational History  . Not on file  Social Needs  . Financial resource strain: Not hard at all  . Food insecurity    Worry: Never true    Inability: Never true  . Transportation needs    Medical: No    Non-medical: No  Tobacco Use  . Smoking status: Never Smoker  . Smokeless tobacco: Never Used  Substance and Sexual Activity  . Alcohol use: No    Alcohol/week: 0.0 standard drinks  . Drug use: No  . Sexual activity: Yes    Partners: Male    Birth control/protection: None  Comment: Hysterectomy-Total  Lifestyle  . Physical activity    Days per week: 0 days    Minutes per session: 0 min  . Stress: Not at all  Relationships  . Social connections    Talks on phone: More than three times a week    Gets together: More than three times a week    Attends religious service: More than 4 times per year    Active member of club or organization: Yes    Attends meetings of clubs or organizations: More than 4 times per year    Relationship status: Married  . Intimate partner violence    Fear of current or ex partner: No     Emotionally abused: No    Physically abused: No    Forced sexual activity: No  Other Topics Concern  . Not on file  Social History Narrative  . Not on file     Current Outpatient Medications:  .  amlodipine-atorvastatin (CADUET) 10-20 MG tablet, Take 1 tablet by mouth once daily, Disp: 30 tablet, Rfl: 0 .  aspirin 81 MG tablet, Take 1 tablet by mouth daily., Disp: , Rfl:  .  olmesartan-hydrochlorothiazide (BENICAR HCT) 40-25 MG tablet, Take 1 tablet by mouth daily., Disp: 90 tablet, Rfl: 1  Allergies  Allergen Reactions  . Peanut Oil Diarrhea    I personally reviewed active problem list, medication list, allergies, family history, social history, health maintenance with the patient/caregiver today.   ROS  Ten systems reviewed and is negative except as mentioned in HPI   Objective  Virtual encounter, vitals not obtained.  There is no height or weight on file to calculate BMI.  Physical Exam  Awake, alert and oriented   PHQ2/9: Depression screen [facility] 2/9 02/02/2019 07/22/2018 02/24/2018 01/21/2018 10/07/2016  Decreased Interest 0 0 0 0 0  Down, Depressed, Hopeless 0 0 0 0 0  PHQ - 2 Score 0 0 0 0 0  Altered sleeping 0 0 0 - -  Tired, decreased energy 0 0 0 - -  Change in appetite 0 0 0 - -  Feeling bad or failure about yourself  0 0 0 - -  Trouble concentrating 0 0 0 - -  Moving slowly or fidgety/restless 0 0 0 - -  Suicidal thoughts 0 0 0 - -  PHQ-9 Score 0 0 0 - -  Difficult doing work/chores - Not difficult at all Not difficult at all - -   PHQ-2/9 Result is negative.    Fall Risk: Fall Risk  02/02/2019 07/22/2018 02/24/2018 01/21/2018 06/20/2017  Falls in the past year? 0 0 Yes Yes No  Comment - - - - -  Number falls in past yr: 0 - 1 1 -  Injury with Fall? 0 - Yes Yes -     Assessment & Plan  1. Dyslipidemia  - Lipid panel - amlodipine-atorvastatin (CADUET) 10-20 MG tablet; Take 1 tablet by mouth daily.  Dispense: 90 tablet; Refill: 1  2. Hypertension, benign   - COMPLETE METABOLIC PANEL WITH GFR - CBC with Differential/Platelet - amlodipine-atorvastatin (CADUET) 10-20 MG tablet; Take 1 tablet by mouth daily.  Dispense: 90 tablet; Refill: 1 - olmesartan-hydrochlorothiazide (BENICAR HCT) 40-25 MG tablet; Take 1 tablet by mouth daily.  Dispense: 90 tablet; Refill: 1  3. Pre-diabetes   4. Primary osteoarthritis of right knee  Doing well at this time  5. Hyperglycemia  - Hemoglobin A1c  I discussed the assessment and treatment plan with the patient. The patient was  provided an opportunity to ask questions and all were answered. The patient agreed with the plan and demonstrated an understanding of the instructions.  The patient was advised to call back or seek an in-person evaluation if the symptoms worsen or if the condition fails to improve as anticipated.  I provided 25 minutes of non-face-to-face time during this encounter.

## 2019-02-09 ENCOUNTER — Ambulatory Visit (INDEPENDENT_AMBULATORY_CARE_PROVIDER_SITE_OTHER): Payer: Medicare Other

## 2019-02-09 ENCOUNTER — Other Ambulatory Visit: Payer: Self-pay

## 2019-02-09 VITALS — BP 132/80

## 2019-02-09 DIAGNOSIS — I10 Essential (primary) hypertension: Secondary | ICD-10-CM | POA: Diagnosis not present

## 2019-02-09 DIAGNOSIS — Z23 Encounter for immunization: Secondary | ICD-10-CM | POA: Diagnosis not present

## 2019-02-09 DIAGNOSIS — R739 Hyperglycemia, unspecified: Secondary | ICD-10-CM | POA: Diagnosis not present

## 2019-02-09 DIAGNOSIS — E785 Hyperlipidemia, unspecified: Secondary | ICD-10-CM | POA: Diagnosis not present

## 2019-02-10 LAB — CBC WITH DIFFERENTIAL/PLATELET
Absolute Monocytes: 549 cells/uL (ref 200–950)
Basophils Absolute: 62 cells/uL (ref 0–200)
Basophils Relative: 1.1 %
Eosinophils Absolute: 129 cells/uL (ref 15–500)
Eosinophils Relative: 2.3 %
HCT: 37 % (ref 35.0–45.0)
Hemoglobin: 12.5 g/dL (ref 11.7–15.5)
Lymphs Abs: 1932 cells/uL (ref 850–3900)
MCH: 29.6 pg (ref 27.0–33.0)
MCHC: 33.8 g/dL (ref 32.0–36.0)
MCV: 87.7 fL (ref 80.0–100.0)
MPV: 9.4 fL (ref 7.5–12.5)
Monocytes Relative: 9.8 %
Neutro Abs: 2929 cells/uL (ref 1500–7800)
Neutrophils Relative %: 52.3 %
Platelets: 372 10*3/uL (ref 140–400)
RBC: 4.22 10*6/uL (ref 3.80–5.10)
RDW: 12.7 % (ref 11.0–15.0)
Total Lymphocyte: 34.5 %
WBC: 5.6 10*3/uL (ref 3.8–10.8)

## 2019-02-10 LAB — COMPLETE METABOLIC PANEL WITH GFR
AG Ratio: 1.3 (calc) (ref 1.0–2.5)
ALT: 26 U/L (ref 6–29)
AST: 22 U/L (ref 10–35)
Albumin: 4.3 g/dL (ref 3.6–5.1)
Alkaline phosphatase (APISO): 102 U/L (ref 37–153)
BUN/Creatinine Ratio: 21 (calc) (ref 6–22)
BUN: 23 mg/dL (ref 7–25)
CO2: 28 mmol/L (ref 20–32)
Calcium: 10 mg/dL (ref 8.6–10.4)
Chloride: 103 mmol/L (ref 98–110)
Creat: 1.12 mg/dL — ABNORMAL HIGH (ref 0.50–0.99)
GFR, Est African American: 59 mL/min/{1.73_m2} — ABNORMAL LOW (ref >=60)
GFR, Est Non African American: 51 mL/min/{1.73_m2} — ABNORMAL LOW (ref >=60)
Globulin: 3.2 g/dL (calc) (ref 1.9–3.7)
Glucose, Bld: 102 mg/dL — ABNORMAL HIGH (ref 65–99)
Potassium: 4.4 mmol/L (ref 3.5–5.3)
Sodium: 139 mmol/L (ref 135–146)
Total Bilirubin: 0.8 mg/dL (ref 0.2–1.2)
Total Protein: 7.5 g/dL (ref 6.1–8.1)

## 2019-02-10 LAB — HEMOGLOBIN A1C
Hgb A1c MFr Bld: 6.2 % of total Hgb — ABNORMAL HIGH (ref <5.7)
Mean Plasma Glucose: 131 (calc)
eAG (mmol/L): 7.3 (calc)

## 2019-02-10 LAB — LIPID PANEL
Cholesterol: 141 mg/dL (ref <200)
HDL: 55 mg/dL (ref >=50)
LDL Cholesterol (Calc): 72 mg/dL (calc)
Non-HDL Cholesterol (Calc): 86 mg/dL (calc) (ref <130)
Total CHOL/HDL Ratio: 2.6 (calc) (ref <5.0)
Triglycerides: 60 mg/dL (ref <150)

## 2019-02-24 ENCOUNTER — Other Ambulatory Visit: Payer: Self-pay | Admitting: Family Medicine

## 2019-02-24 DIAGNOSIS — Z0000 Encounter for general adult medical examination without abnormal findings: Secondary | ICD-10-CM

## 2019-04-22 ENCOUNTER — Other Ambulatory Visit: Payer: Self-pay | Admitting: Family Medicine

## 2019-04-22 DIAGNOSIS — E2839 Other primary ovarian failure: Secondary | ICD-10-CM

## 2019-04-22 DIAGNOSIS — Z1231 Encounter for screening mammogram for malignant neoplasm of breast: Secondary | ICD-10-CM

## 2019-04-22 DIAGNOSIS — Z0000 Encounter for general adult medical examination without abnormal findings: Secondary | ICD-10-CM

## 2019-08-02 ENCOUNTER — Ambulatory Visit: Payer: Medicare Other | Admitting: Family Medicine

## 2019-08-09 ENCOUNTER — Encounter: Payer: Self-pay | Admitting: Family Medicine

## 2019-08-09 ENCOUNTER — Ambulatory Visit (INDEPENDENT_AMBULATORY_CARE_PROVIDER_SITE_OTHER): Payer: Medicare Other | Admitting: Family Medicine

## 2019-08-09 ENCOUNTER — Other Ambulatory Visit: Payer: Self-pay

## 2019-08-09 VITALS — BP 120/80 | HR 96 | Temp 96.6°F | Resp 16 | Ht 62.0 in | Wt 148.6 lb

## 2019-08-09 DIAGNOSIS — R739 Hyperglycemia, unspecified: Secondary | ICD-10-CM

## 2019-08-09 DIAGNOSIS — R7303 Prediabetes: Secondary | ICD-10-CM

## 2019-08-09 DIAGNOSIS — E785 Hyperlipidemia, unspecified: Secondary | ICD-10-CM | POA: Diagnosis not present

## 2019-08-09 DIAGNOSIS — M1711 Unilateral primary osteoarthritis, right knee: Secondary | ICD-10-CM

## 2019-08-09 DIAGNOSIS — I10 Essential (primary) hypertension: Secondary | ICD-10-CM

## 2019-08-09 MED ORDER — OLMESARTAN MEDOXOMIL-HCTZ 40-25 MG PO TABS: 1.0000 | ORAL_TABLET | Freq: Every day | ORAL | 1 refills | Status: DC

## 2019-08-09 MED ORDER — AMLODIPINE-ATORVASTATIN 10-20 MG PO TABS: 1.0000 | ORAL_TABLET | Freq: Every day | ORAL | 1 refills | Status: DC

## 2019-08-09 NOTE — Progress Notes (Signed)
Name: Bianca Suarez   MRN: [id number]    DOB: 03/04/1951   Date:08/09/2019       Progress Note  Subjective  Chief Complaint  Chief Complaint  Patient presents with  . Hypertension  . Hyperglycemia  . Dyslipidemia    HPI  HTNand hyperlipidemia:taking medication as prescribed, she denies side effects. No chest pain or palpitation. BP has been at goal at home and also here in our office today   Pre-diabetes: last Hbg A1C down to 6.2 % she denies polyphagia, polydipsia or polyuria. Discussed importance of life style modification.She is cutting down on drinking Pepsi, only drinks less than 6 oz per day   Hyperlipidemia: reviewed labs with patient LDL is at goal 72, HDL also at goal at 55. Normal triglycerides , she is on Caduet no side effects, recheck it yearly   OA: she states right knee has intermittent effusion ( symptoms are intermittent for years) she states [place] and since not going up and down stairs as often she is doing better. Still has a catch at times but not very frequent . She denies any pain    Patient Active Problem List   Diagnosis Date Noted  . Pre-diabetes 07/22/2018  . Hyperglycemia 01/21/2018  . Dyslipidemia 12/24/2014  . Fibrocystic breast disease 12/24/2014  . 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 H/O transient cerebral ischemia 12/24/2014  . Benign hypertension 12/24/2014  . Lateral epicondylitis of left elbow 12/24/2014    Past Surgical History:  Procedure Laterality Date  . ABDOMINAL HYSTERECTOMY  05/20/2002  . COSMETIC SURGERY  2001   lens replacement  . EYE SURGERY    . FOOT SURGERY Bilateral 09/29/2017   Bone Spurs Cut Down    Family History  Problem Relation Age of Onset  . Diabetes Mother   . Hypertension Mother   . Hypertension Father   . CVA Father   . Hypertension Sister   . Stroke Maternal Grandmother   . Heart disease Paternal Grandmother   . Diabetes  Paternal Grandmother     Social History   Tobacco Use  . Smoking status: Never Smoker  . Smokeless tobacco: Never Used  Substance Use Topics  . Alcohol use: No    Alcohol/week: 0.0 standard drinks     Current Outpatient Medications:  .  amlodipine-atorvastatin (CADUET) 10-20 MG tablet, Take 1 tablet by mouth daily., Disp: 90 tablet, Rfl: 1 .  aspirin 81 MG tablet, Take 1 tablet by mouth daily., Disp: , Rfl:  .  olmesartan-hydrochlorothiazide (BENICAR HCT) 40-25 MG tablet, Take 1 tablet by mouth daily., Disp: 90 tablet, Rfl: 1  Allergies  Allergen Reactions  . Peanut Oil Diarrhea    I personally reviewed active problem list, medication list, allergies, family history, social history, health maintenance with the patient/caregiver today.   ROS  Constitutional: Negative for fever . [psotove for weight change.  Respiratory: Negative for cough and shortness of breath.   Cardiovascular: Negative for chest pain or palpitations.  Gastrointestinal: Negative for abdominal pain, no bowel changes.  Musculoskeletal: Negative for gait problem or joint swelling.  Skin: Negative for rash.  Neurological: Negative for dizziness or headache.  No other specific complaints in a complete review of systems (except as listed in HPI above).  Objective  Vitals:   08/09/19 1038  BP: 120/80  Pulse: 96  Resp: 16  Temp: (!) 96.6 F (35.9 C)  TempSrc: Temporal  SpO2: 98%  Weight: 148 lb 9.6 oz (67.4 kg)  Height: 5\' 2"  (1.575 m)    Body mass index is 27.18 kg/m.  Physical Exam  Constitutional: Patient appears well-developed and well-nourished. Overweight. No distress.  HEENT: head atraumatic, normocephalic, pupils equal and reactive to light Cardiovascular: Normal rate, regular rhythm and normal heart sounds.  No murmur heard. No BLE edema. Pulmonary/Chest: Effort normal and breath sounds normal. No respiratory distress. Abdominal: Soft.  There is no tenderness. Psychiatric: Patient has  a normal mood and affect. behavior is normal. Judgment and thought content normal.  PHQ2/9: Depression screen [facility] 2/9 08/09/2019 02/02/2019 07/22/2018 02/24/2018 01/21/2018  Decreased Interest 0 0 0 0 0  Down, Depressed, Hopeless 0 0 0 0 0  PHQ - 2 Score 0 0 0 0 0  Altered sleeping 0 0 0 0 -  Tired, decreased energy 0 0 0 0 -  Change in appetite 0 0 0 0 -  Feeling bad or failure about yourself  0 0 0 0 -  Trouble concentrating 0 0 0 0 -  Moving slowly or fidgety/restless 0 0 0 0 -  Suicidal thoughts 0 0 0 0 -  PHQ-9 Score 0 0 0 0 -  Difficult doing work/chores - - Not difficult at all Not difficult at all -    phq 9 is negative   Fall Risk: Fall Risk  08/09/2019 02/02/2019 07/22/2018 02/24/2018 01/21/2018  Falls in the past year? 0 0 0 Yes Yes  Comment - - - - -  Number falls in past yr: 0 0 - 1 1  Injury with Fall? 0 0 - Yes Yes     Functional Status Survey: Is the patient deaf or have difficulty hearing?: No Does the patient have difficulty seeing, even when wearing glasses/contacts?: No Does the patient have difficulty concentrating, remembering, or making decisions?: No Does the patient have difficulty walking or climbing stairs?: No Does the patient have difficulty dressing or bathing?: No Does the patient have difficulty doing errands alone such as visiting a doctor's office or shopping?: No   Assessment & Plan  1. Hypertension, benign  - olmesartan-hydrochlorothiazide (BENICAR HCT) 40-25 MG tablet; Take 1 tablet by mouth daily.  Dispense: 90 tablet; Refill: 1 - amlodipine-atorvastatin (CADUET) 10-20 MG tablet; Take 1 tablet by mouth daily.  Dispense: 90 tablet; Refill: 1  2. Dyslipidemia  - amlodipine-atorvastatin (CADUET) 10-20 MG tablet; Take 1 tablet by mouth daily.  Dispense: 90 tablet; Refill: 1  3. Pre-diabetes   4. Primary osteoarthritis of right knee   5. Hyperglycemia

## 2019-08-19 ENCOUNTER — Ambulatory Visit (INDEPENDENT_AMBULATORY_CARE_PROVIDER_SITE_OTHER): Payer: Medicare Other

## 2019-08-19 DIAGNOSIS — Z0000 Encounter for general adult medical examination without abnormal findings: Secondary | ICD-10-CM | POA: Diagnosis not present

## 2019-08-19 NOTE — Patient Instructions (Signed)
Bianca Suarez , Thank you for taking time to come for your Medicare Wellness Visit. I appreciate your ongoing commitment to your health goals. Please review the following plan we discussed and let me know if I can assist you in the future.   Screening recommendations/referrals: Colonoscopy: done 03/20/11. Repeat in 2022. Mammogram: done 2016. Please call [phone] to schedule your mammogram and bone density screening.  Bone Density: done 2016 Recommended yearly ophthalmology/optometry visit for glaucoma screening and checkup Recommended yearly dental visit for hygiene and checkup  Vaccinations: Influenza vaccine: postponed Pneumococcal vaccine: done 02/09/19 Tdap vaccine: done 02/11/11 Shingles vaccine: Shingrix completed 10/07/16 & 06/20/17    Advanced directives: Advance directive discussed with you today. I have provided a copy for you to complete at home and have notarized. Once this is complete please bring a copy in to our office so we can scan it into your chart.  Conditions/risks identified: Recommend increasing physical activity   Next appointment: Please follow up in one year for your Medicare Annual Wellness visit.     Preventive Care 77 Years and Older, Female Preventive care refers to lifestyle choices and visits with your health care provider that can promote health and wellness. What does preventive care include?  A yearly physical exam. This is also called an annual well check.  Dental exams once or twice a year.  Routine eye exams. Ask your health care provider how often you should have your eyes checked.  Personal lifestyle choices, including:  Daily care of your teeth and gums.  Regular physical activity.  Eating a healthy diet.  Avoiding tobacco and drug use.  Limiting alcohol use.  Practicing safe sex.  Taking low-dose aspirin every day.  Taking vitamin and mineral supplements as recommended by your health care provider. What happens during an  annual well check? The services and screenings done by your health care provider during your annual well check will depend on your age, overall health, lifestyle risk factors, and family history of disease. Counseling  Your health care provider may ask you questions about your:  Alcohol use.  Tobacco use.  Drug use.  Emotional well-being.  Home and relationship well-being.  Sexual activity.  Eating habits.  History of falls.  Memory and ability to understand (cognition).  Work and work Statistician.  Reproductive health. Screening  You may have the following tests or measurements:  Height, weight, and BMI.  Blood pressure.  Lipid and cholesterol levels. These may be checked every 5 years, or more frequently if you are over 19 years old.  Skin check.  Lung cancer screening. You may have this screening every year starting at age 54 if you have a 30-pack-year history of smoking and currently smoke or have quit within the past 15 years.  Fecal occult blood test (FOBT) of the stool. You may have this test every year starting at age 63.  Flexible sigmoidoscopy or colonoscopy. You may have a sigmoidoscopy every 5 years or a colonoscopy every 10 years starting at age 49.  Hepatitis C blood test.  Hepatitis B blood test.  Sexually transmitted disease (STD) testing.  Diabetes screening. This is done by checking your blood sugar (glucose) after you have not eaten for a while (fasting). You may have this done every 1-3 years.  Bone density scan. This is done to screen for osteoporosis. You may have this done starting at age 52.  Mammogram. This may be done every 1-2 years. Talk to your health care provider about how often  you should have regular mammograms. Talk with your health care provider about your test results, treatment options, and if necessary, the need for more tests. Vaccines  Your health care provider may recommend certain vaccines, such as:  Influenza  vaccine. This is recommended every year.  Tetanus, diphtheria, and acellular pertussis (Tdap, Td) vaccine. You may need a Td booster every 10 years.  Zoster vaccine. You may need this after age 5.  Pneumococcal 13-valent conjugate (PCV13) vaccine. One dose is recommended after age 32.  Pneumococcal polysaccharide (PPSV23) vaccine. One dose is recommended after age 19. Talk to your health care provider about which screenings and vaccines you need and how often you need them. This information is not intended to replace advice given to you by your health care provider. Make sure you discuss any questions you have with your health care provider. Document Released: 06/02/2015 Document Revised: 01/24/2016 Document Reviewed: 03/07/2015 Elsevier Interactive Patient Education  2017 [place] Prevention in the Home Falls can cause injuries. They can happen to people of all ages. There are many things you can do to make your home safe and to help prevent falls. What can I do on the outside of my home?  Regularly fix the edges of walkways and driveways and fix any cracks.  Remove anything that might make you trip as you walk through a door, such as a raised step or threshold.  Trim any bushes or trees on the path to your home.  Use bright outdoor lighting.  Clear any walking paths of anything that might make someone trip, such as rocks or tools.  Regularly check to see if handrails are loose or broken. Make sure that both sides of any steps have handrails.  Any raised decks and porches should have guardrails on the edges.  Have any leaves, snow, or ice cleared regularly.  Use sand or salt on walking paths during winter.  Clean up any spills in your garage right away. This includes oil or grease spills. What can I do in the bathroom?  Use night lights.  Install grab bars by the toilet and in the tub and shower. Do not use towel bars as grab bars.  Use non-skid mats or decals  in the tub or shower.  If you need to sit down in the shower, use a plastic, non-slip stool.  Keep the floor dry. Clean up any water that spills on the floor as soon as it happens.  Remove soap buildup in the tub or shower regularly.  Attach bath mats securely with double-sided non-slip rug tape.  Do not have throw rugs and other things on the floor that can make you trip. What can I do in the bedroom?  Use night lights.  Make sure that you have a light by your bed that is easy to reach.  Do not use any sheets or blankets that are too big for your bed. They should not hang down onto the floor.  Have a firm chair that has side arms. You can use this for support while you get dressed.  Do not have throw rugs and other things on the floor that can make you trip. What can I do in the kitchen?  Clean up any spills right away.  Avoid walking on wet floors.  Keep items that you use a lot in easy-to-reach places.  If you need to reach something above you, use a strong step stool that has a grab bar.  Keep electrical cords  out of the way.  Do not use floor polish or wax that makes floors slippery. If you must use wax, use non-skid floor wax.  Do not have throw rugs and other things on the floor that can make you trip. What can I do with my stairs?  Do not leave any items on the stairs.  Make sure that there are handrails on both sides of the stairs and use them. Fix handrails that are broken or loose. Make sure that handrails are as long as the stairways.  Check any carpeting to make sure that it is firmly attached to the stairs. Fix any carpet that is loose or worn.  Avoid having throw rugs at the top or bottom of the stairs. If you do have throw rugs, attach them to the floor with carpet tape.  Make sure that you have a light switch at the top of the stairs and the bottom of the stairs. If you do not have them, ask someone to add them for you. What else can I do to help  prevent falls?  Wear shoes that:  Do not have high heels.  Have rubber bottoms.  Are comfortable and fit you well.  Are closed at the toe. Do not wear sandals.  If you use a stepladder:  Make sure that it is fully opened. Do not climb a closed stepladder.  Make sure that both sides of the stepladder are locked into place.  Ask someone to hold it for you, if possible.  Clearly mark and make sure that you can see:  Any grab bars or handrails.  First and last steps.  Where the edge of each step is.  Use tools that help you move around (mobility aids) if they are needed. These include:  Canes.  Walkers.  Scooters.  Crutches.  Turn on the lights when you go into a dark area. Replace any light bulbs as soon as they burn out.  Set up your furniture so you have a clear path. Avoid moving your furniture around.  If any of your floors are uneven, fix them.  If there are any pets around you, be aware of where they are.  Review your medicines with your doctor. Some medicines can make you feel dizzy. This can increase your chance of falling. Ask your doctor what other things that you can do to help prevent falls. This information is not intended to replace advice given to you by your health care provider. Make sure you discuss any questions you have with your health care provider. Document Released: 03/02/2009 Document Revised: 10/12/2015 Document Reviewed: 06/10/2014 Elsevier Interactive Patient Education  2017 [employer].

## 2019-08-19 NOTE — Progress Notes (Addendum)
Subjective:   Bianca Suarez is a 69 y.o. female who presents for an Initial Medicare Annual Wellness Visit.  Virtual Visit via Telephone Note  I connected with [name] on 08/19/19 at 10:00 AM EDT by telephone and verified that I am speaking with the correct person using two identifiers.  Medicare Annual Wellness visit completed telephonically due to Covid-19 pandemic.   Location: Patient: home Provider: office   I discussed the limitations, risks, security and privacy concerns of performing an evaluation and management service by telephone and the availability of in person appointments. The patient expressed understanding and agreed to proceed.  Some vital signs may be absent or patient reported.   Clemetine Marker, LPN    Review of Systems      Cardiac Risk Factors include: advanced age (>74men, >55 women);hypertension;dyslipidemia     Objective:    There were no vitals filed for this visit. There is no height or weight on file to calculate BMI.  Advanced Directives 08/19/2019 10/07/2016 06/11/2016 04/02/2016 10/05/2015 03/13/2015  Does Patient Have a Medical Advance Directive? No No No No No No  Would patient like information on creating a medical advance directive? Yes (MAU/Ambulatory/Procedural Areas - Information given) - - No - patient declined information No - patient declined information No - patient declined information    Current Medications (verified) Outpatient Encounter Medications as of 08/19/2019  Medication Sig  . amlodipine-atorvastatin (CADUET) 10-20 MG tablet Take 1 tablet by mouth daily.  Marland Kitchen aspirin 81 MG tablet Take 1 tablet by mouth daily.  Marland Kitchen olmesartan-hydrochlorothiazide (BENICAR HCT) 40-25 MG tablet Take 1 tablet by mouth daily.   No facility-administered encounter medications on file as of 08/19/2019.    Allergies (verified) Patient has no known allergies.   History: Past Medical History:  Diagnosis Date  . Fibrocystic disease of left  breast   . Glaucoma   . Hyperlipidemia   . Hypertension   . Lactose intolerance in adult   . Left lateral epicondylitis   . TIA (transient ischemic attack)    Past Surgical History:  Procedure Laterality Date  . ABDOMINAL HYSTERECTOMY  05/20/2002  . COSMETIC SURGERY  2001   lens replacement  . EYE SURGERY    . FOOT SURGERY Bilateral 09/29/2017   Bone Spurs Cut Down   Family History  Problem Relation Age of Onset  . Diabetes Mother   . Hypertension Mother   . Hypertension Father   . CVA Father   . Hypertension Sister   . Stroke Maternal Grandmother   . Heart disease Paternal Grandmother   . Diabetes Paternal Grandmother    Social History   Socioeconomic History  . Marital status: Married    Spouse name: Belenda Cruise  . Number of children: 3  . Years of education: Not on file  . Highest education level: Associate degree: academic program  Occupational History  . Not on file  Tobacco Use  . Smoking status: Never Smoker  . Smokeless tobacco: Never Used  Substance and Sexual Activity  . Alcohol use: No    Alcohol/week: 0.0 standard drinks  . Drug use: No  . Sexual activity: Yes    Partners: Male    Birth control/protection: None    Comment: Hysterectomy-Total  Other Topics Concern  . Not on file  Social History Narrative  . Not on file   Social Determinants of Health   Financial Resource Strain: Low Risk   . Difficulty of Paying Living Expenses: Not hard at all  Food Insecurity: No Food Insecurity  . Worried About Charity fundraiser in the Last Year: Never true  . Ran Out of Food in the Last Year: Never true  Transportation Needs: No Transportation Needs  . Lack of Transportation (Medical): No  . Lack of Transportation (Non-Medical): No  Physical Activity: Inactive  . Days of Exercise per Week: 0 days  . Minutes of Exercise per Session: 0 min  Stress: No Stress Concern Present  . Feeling of Stress : Not at all  Social Connections: Not Isolated  . Frequency  of Communication with Friends and Family: More than three times a week  . Frequency of Social Gatherings with Friends and Family: More than three times a week  . Attends Religious Services: More than 4 times per year  . Active Member of Clubs or Organizations: Yes  . Attends Archivist Meetings: More than 4 times per year  . Marital Status: Married    Tobacco Counseling Counseling given: Not Answered   Clinical Intake:  Pre-visit preparation completed: Yes  Pain : No/denies pain     Nutritional Risks: None Diabetes: No  How often do you need to have someone help you when you read instructions, pamphlets, or other written materials from your doctor or pharmacy?: 1 - Never  Interpreter Needed?: No  Information entered by :: Clemetine Marker LPN   Activities of Daily Living In your present state of health, do you have any difficulty performing the following activities: 08/19/2019 08/09/2019  Hearing? N N  Comment declines hearing aids -  Vision? N N  Difficulty concentrating or making decisions? N N  Walking or climbing stairs? N N  Dressing or bathing? N N  Doing errands, shopping? N N  Preparing Food and eating ? N -  Using the Toilet? N -  In the past six months, have you accidently leaked urine? N -  Do you have problems with loss of bowel control? N -  Managing your Medications? N -  Managing your Finances? N -  Housekeeping or managing your Housekeeping? N -  Some recent data might be hidden     Immunizations and Health Maintenance Immunization History  Administered Date(s) Administered  . Pneumococcal Conjugate-13 01/21/2018  . Pneumococcal Polysaccharide-23 02/09/2019  . Tdap 02/11/2011  . Zoster Recombinat (Shingrix) 10/07/2016, 06/20/2017   Health Maintenance Due  Topic Date Due  . MAMMOGRAM  04/06/2017  . DEXA SCAN  Never done    Patient Care Team: Steele Sizer, MD as PCP - General (Family Medicine) Edrick Kins, DPM as Consulting  Physician (Podiatry) Thelma Comp, [place] (Ophthalmology)  Indicate any recent Medical Services you may have received from other than [facility] providers in the past year (date may be approximate).     Assessment:   This is a routine wellness examination for [facility].  Hearing/Vision screen  Hearing Screening   125Hz  250Hz  500Hz  1000Hz  2000Hz  3000Hz  4000Hz  6000Hz  8000Hz   Right ear:           Left ear:           Comments: Pt denies hearing difficulty  Vision Screening Comments: [facility] screen[facility]  Dietary issues and exercise activities discussed: Current Exercise Habits: The patient does not participate in regular exercise at present, Exercise limited by: None identified  Goals   None    Depression Screen PHQ 2/9 Scores 08/19/2019 08/09/2019 02/02/2019 07/22/2018 02/24/2018 01/21/2018 10/07/2016  PHQ - 2 Score 0 0 0 0  0 0 0  PHQ- 9 Score - 0 0 0 0 - -    Fall Risk Fall Risk  08/19/2019 08/09/2019 02/02/2019 07/22/2018 02/24/2018  Falls in the past year? 0 0 0 0 Yes  Comment - - - - -  Number falls in past yr: 0 0 0 - 1  Injury with Fall? 0 0 0 - Yes  Risk for fall due to : No Fall Risks - - - -  Follow up Falls prevention discussed - - - -    FALL RISK PREVENTION PERTAINING TO THE HOME:  Any stairs in or around the home? Yes  If so, do they handrails? Yes   Home free of loose throw rugs in walkways, pet beds, electrical cords, etc? Yes  Adequate lighting in your home to reduce risk of falls? Yes   ASSISTIVE DEVICES UTILIZED TO PREVENT FALLS:  Life alert? No  Use of a cane, walker or w/c? No  Grab bars in the bathroom? No  Shower chair or bench in shower? No  Elevated toilet seat or a handicapped toilet? Yes  DME ORDERS:  DME order needed?  No   TIMED UP AND GO:  Was the test performed? No . Telephonic visit.   Education: Fall risk prevention has been discussed.  Intervention(s) required? No    Cognitive Function: pt declines 6CIT for 2021  AWV        Screening Tests Health Maintenance  Topic Date Due  . MAMMOGRAM  04/06/2017  . DEXA SCAN  Never done  . INFLUENZA VACCINE  12/19/2019  . TETANUS/TDAP  02/10/2021  . COLONOSCOPY  03/19/2021  . Hepatitis C Screening  Completed  . PNA vac Low Risk Adult  Completed    Qualifies for Shingles Vaccine? Yes  Shingrix series completed.   Tdap: Up to date  Flu Vaccine: Due for Flu vaccine. Does the patient want to receive this vaccine today?  No . Education has been provided regarding the importance of this vaccine but still declined. Advised may receive this vaccine at local pharmacy or Health Dept. Aware to provide a copy of the vaccination record if obtained from local pharmacy or Health Dept. Verbalized acceptance and understanding.  Pneumococcal Vaccine: Up to date   Cancer Screenings:  Colorectal Screening: Completed 03/20/11. Repeat every 10 years;   Mammogram: Completed 2016. Repeat every year. Ordered 04/22/19. Pt provided with contact information and advised to call to schedule appt.   Bone Density: Completed 2016. Results reflect  OSTEOPENIA. Repeat every 2 years. Ordered 04/22/19. Pt provided with contact information and advised to call to schedule appt.   Lung Cancer Screening: (Low Dose CT Chest recommended if Age 10-80 years, 30 pack-year currently smoking OR have quit w/in 15years.) does not qualify.   Additional Screening:  Hepatitis C Screening: does qualify; Completed 09/14/13  Vision Screening: Recommended annual ophthalmology exams for early detection of glaucoma and other disorders of the eye. Is the patient up to date with their annual eye exam?  No  Who is the provider or what is the name of the office in which the pt attends annual eye exams? [place]  Dental Screening: Recommended annual dental exams for proper oral hygiene  Community Resource Referral:  CRR required this visit?  No     Plan:    I have personally reviewed and  addressed the Medicare Annual Wellness questionnaire and have noted the following in the patient's chart:  A. Medical and social history B. Use of  alcohol, tobacco or illicit drugs  C. Current medications and supplements D. Functional ability and status E.  Nutritional status F.  Physical activity G. Advance directives H. List of other physicians I.  Hospitalizations, surgeries, and ER visits in previous 12 months J.  [place] such as hearing and vision if needed, cognitive and depression L. Referrals and appointments   In addition, I have reviewed and discussed with patient certain preventive protocols, quality metrics, and best practice recommendations. A written personalized care plan for preventive services as well as general preventive health recommendations were provided to patient.   Signed,  Clemetine Marker, LPN Nurse Health Advisor   Nurse Notes: none

## 2020-01-03 DIAGNOSIS — H524 Presbyopia: Secondary | ICD-10-CM | POA: Diagnosis not present

## 2020-01-03 DIAGNOSIS — Z961 Presence of intraocular lens: Secondary | ICD-10-CM | POA: Diagnosis not present

## 2020-02-09 ENCOUNTER — Ambulatory Visit: Payer: Medicare Other | Admitting: Family Medicine

## 2020-02-19 ENCOUNTER — Other Ambulatory Visit: Payer: Self-pay | Admitting: Family Medicine

## 2020-02-19 DIAGNOSIS — I10 Essential (primary) hypertension: Secondary | ICD-10-CM

## 2020-02-19 DIAGNOSIS — E785 Hyperlipidemia, unspecified: Secondary | ICD-10-CM

## 2020-02-19 NOTE — Telephone Encounter (Signed)
Requested Prescriptions  Pending Prescriptions Disp Refills  . olmesartan-hydrochlorothiazide (BENICAR HCT) 40-25 MG tablet [Pharmacy Med Name: Olmesartan Medoxomil-HCTZ 40-25 MG Oral Tablet] 90 tablet 0    Sig: Take 1 tablet by mouth once daily     Cardiovascular: ARB + Diuretic Combos Failed - 02/19/2020  9:22 AM      Failed - K in normal range and within 180 days    Potassium  Date Value Ref Range Status  02/09/2019 4.4 3.5 - 5.3 mmol/L Final  02/14/2013 3.4 (L) 3.5 - 5.1 mmol/L Final         Failed - Na in normal range and within 180 days    Sodium  Date Value Ref Range Status  02/09/2019 139 135 - 146 mmol/L Final  02/14/2013 137 136 - 145 mmol/L Final         Failed - Cr in normal range and within 180 days    Creat  Date Value Ref Range Status  02/09/2019 1.12 (H) 0.50 - 0.99 mg/dL Final    Comment:    For patients >82 years of age, the reference limit for Creatinine is approximately 13% higher for people identified as African-American. .          Failed - Ca in normal range and within 180 days    Calcium  Date Value Ref Range Status  02/09/2019 10.0 8.6 - 10.4 mg/dL Final   Calcium, Total  Date Value Ref Range Status  02/14/2013 9.3 8.5 - 10.1 mg/dL Final         Failed - Valid encounter within last 6 months    Recent Outpatient Visits          6 months ago Pre-diabetes   [facility] Steele Sizer, MD   1 year ago Pre-diabetes   [facility] [place], Drue Stager, MD   1 year ago Hypertension, benign   [facility] [place], Drue Stager, MD   1 year ago [facility] preventive visit   [facility] Steele Sizer, MD   2 years ago Hypertension, benign   [facility] Steele Sizer, MD      Future Appointments            In 1 week Steele Sizer, MD [facility], [place]   In 6 months  [facility], [place]  - Patient is not pregnant      Passed - Last BP in normal range    BP Readings from Last 1 Encounters:  08/09/19 120/80         . amlodipine-atorvastatin (CADUET) 10-20 MG tablet [Pharmacy Med Name: amLODIPine-Atorvastatin 10-20 MG Oral Tablet] 90 tablet 0    Sig: Take 1 tablet by mouth once daily     Cardiovascular: CCB + Statin Combos Failed - 02/19/2020  9:22 AM      Failed - Triglycerides in normal range and within 360 days    Triglycerides  Date Value Ref Range Status  02/09/2019 60 <150 mg/dL Final         Failed - Total Cholesterol in normal range and within 360 days    Cholesterol  Date Value Ref Range Status  02/09/2019 141 <200 mg/dL Final         Failed - LDL in normal range and within 360 days    LDL Cholesterol (Calc)  Date Value Ref Range Status  02/09/2019 72  mg/dL (calc) Final    Comment:    Reference range: <100 . Desirable range <100 mg/dL for primary prevention;   <70 mg/dL for patients with CHD or diabetic patients  with > or = 2 CHD risk factors. Marland Kitchen LDL-C is now calculated using the Martin-Hopkins  calculation, which is a validated novel method providing  better accuracy than the Friedewald equation in the  estimation of LDL-C.  Cresenciano Genre et al. Annamaria Helling. 9892;119(41): 2061-2068  (http://education.[employer].com/faq/FAQ164)          Failed - HDL in normal range and within 360 days    HDL  Date Value Ref Range Status  02/09/2019 55 > OR = 50 mg/dL Final         Failed - Valid encounter within last 6 months    Recent Outpatient Visits          6 months ago Pre-diabetes   [facility] Steele Sizer, MD   1 year ago Pre-diabetes   [facility] Steele Sizer, MD   1 year ago Hypertension, benign   [facility] Steele Sizer, MD   1 year ago [facility] preventive visit   [facility] Steele Sizer, MD   2 years ago Hypertension, benign   [facility] Steele Sizer, MD      Future Appointments            In 1 week Steele Sizer, MD [facility], [place]   In 6 months  [facility], [place] - Patient is not pregnant      Passed - Last BP in normal range    BP Readings from Last 1 Encounters:  08/09/19 120/80

## 2020-02-25 NOTE — Progress Notes (Signed)
Name: Bianca Suarez   MRN: [id number]    DOB: 1951/03/10   Date:02/28/2020       Progress Note  Subjective  Chief Complaint  Follow up: Pre-Diabetes  HPI  HTNand hyperlipidemia:taking medication as prescribed, she denies side effects. No chest pain or palpitation. BP at home within normal limits. She states only gets dizzy when stooping down and standing up shortly after   Pre-diabetes: last Hbg A1C down to 6.2 % she denies polyphagia, polydipsia or polyuria. Discussed importance of life style modification.She is no longer drinking Pepsi daily, she used to drink a lot , last time was down to 6 oz a day and now only half of a small can occasionally   Hyperlipidemia: reviewed labs with patient LDL is at goal 72, HDL also at goal at 55. Normal triglycerides, she is on Caduet no side effects. We will recheck labs today. No myalgias   OA: she states right knee has intermittent effusion ( symptoms are intermittent for years)she states [place] and since not going up and down stairs , swelling has not been present in a while, but sometimes feels like knee will give out, no falls, she states symptoms are stable and sporadic she stopped taking NSAID's because of slight drop of kidney function.   Patient Active Problem List   Diagnosis Date Noted  . Pre-diabetes 07/22/2018  . Hyperglycemia 01/21/2018  . Dyslipidemia 12/24/2014  . Fibrocystic breast disease 12/24/2014  . 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 H/O transient cerebral ischemia 12/24/2014  . Benign hypertension 12/24/2014  . Lateral epicondylitis of left elbow 12/24/2014    Past Surgical History:  Procedure Laterality Date  . ABDOMINAL HYSTERECTOMY  05/20/2002  . COSMETIC SURGERY  2001   lens replacement  . EYE SURGERY    . FOOT SURGERY Bilateral 09/29/2017   Bone Spurs Cut Down    Family History  Problem Relation Age of Onset  . Diabetes  Mother   . Hypertension Mother   . Hypertension Father   . CVA Father   . Hypertension Sister   . Stroke Maternal Grandmother   . Heart disease Paternal Grandmother   . Diabetes Paternal Grandmother     Social History   Tobacco Use  . Smoking status: Never Smoker  . Smokeless tobacco: Never Used  Substance Use Topics  . Alcohol use: No    Alcohol/week: 0.0 standard drinks     Current Outpatient Medications:  .  amlodipine-atorvastatin (CADUET) 10-20 MG tablet, Take 1 tablet by mouth once daily, Disp: 90 tablet, Rfl: 0 .  aspirin 81 MG tablet, Take 1 tablet by mouth daily., Disp: , Rfl:  .  olmesartan-hydrochlorothiazide (BENICAR HCT) 40-25 MG tablet, Take 1 tablet by mouth once daily, Disp: 90 tablet, Rfl: 0  No Known Allergies  I personally reviewed active problem list, PMHX, Social HX,  allergies with the patient/caregiver today.   ROS  Constitutional: Negative for fever or weight change.  Respiratory: Negative for cough and shortness of breath.   Cardiovascular: Negative for chest pain or palpitations.  Gastrointestinal: Negative for abdominal pain, no bowel changes.  Musculoskeletal: Negative for gait problem or joint swelling.  Skin: Negative for rash.  Neurological: Negative for dizziness or headache.  No other specific complaints in a complete review of systems (except as listed in HPI above).  Objective  Vitals:   02/28/20 0922  BP: 118/78  Pulse: 84  Resp: 14  Temp: 98.1 F (36.7 C)  TempSrc: Oral  SpO2: 99%  Weight: 145 lb 1.6 oz (65.8 kg)  Height: 5\' 2"  (1.575 m)    Body mass index is 26.54 kg/m.  Physical Exam  Constitutional: Patient appears well-developed and well-nourished. Overweight.No distress.  HEENT: head atraumatic, normocephalic, pupils equal and reactive to light,  neck supple Cardiovascular: Normal rate, regular rhythm and normal heart sounds.  No murmur heard. No BLE edema. Pulmonary/Chest: Effort normal and breath sounds  normal. No respiratory distress. Abdominal: Soft.  There is no tenderness. Psychiatric: Patient has a normal mood and affect. behavior is normal. Judgment and thought content normal. Muscular skeletal: crepitus with extension of knee   PHQ2/9: Depression screen [facility] 2/9 02/28/2020 08/19/2019 08/09/2019 02/02/2019 07/22/2018  Decreased Interest 0 0 0 0 0  Down, Depressed, Hopeless 0 0 0 0 0  PHQ - 2 Score 0 0 0 0 0  Altered sleeping - - 0 0 0  Tired, decreased energy - - 0 0 0  Change in appetite - - 0 0 0  Feeling bad or failure about yourself  - - 0 0 0  Trouble concentrating - - 0 0 0  Moving slowly or fidgety/restless - - 0 0 0  Suicidal thoughts - - 0 0 0  PHQ-9 Score - - 0 0 0  Difficult doing work/chores - - - - Not difficult at all    phq 9 is negative   Fall Risk: Fall Risk  02/28/2020 08/19/2019 08/09/2019 02/02/2019 07/22/2018  Falls in the past year? 0 0 0 0 0  Comment - - - - -  Number falls in past yr: 0 0 0 0 -  Injury with Fall? 0 0 0 0 -  Risk for fall due to : - No Fall Risks - - -  Follow up - Falls prevention discussed - - -     Functional Status Survey: Is the patient deaf or have difficulty hearing?: No Does the patient have difficulty seeing, even when wearing glasses/contacts?: No Does the patient have difficulty concentrating, remembering, or making decisions?: No Does the patient have difficulty walking or climbing stairs?: No Does the patient have difficulty dressing or bathing?: No Does the patient have difficulty doing errands alone such as visiting a doctor's office or shopping?: No    Assessment & Plan  1. Pre-diabetes   2. Hypertension, benign  - CBC with Differential/Platelet - COMPLETE METABOLIC PANEL WITH GFR  3. Dyslipidemia  - Lipid panel  4. Hyperglycemia  - Hemoglobin A1c  5. Primary osteoarthritis of right knee  Stable, taking Tylenol prn

## 2020-02-28 ENCOUNTER — Encounter: Payer: Self-pay | Admitting: Family Medicine

## 2020-02-28 ENCOUNTER — Ambulatory Visit (INDEPENDENT_AMBULATORY_CARE_PROVIDER_SITE_OTHER): Payer: Medicare Other | Admitting: Family Medicine

## 2020-02-28 ENCOUNTER — Other Ambulatory Visit: Payer: Self-pay

## 2020-02-28 VITALS — BP 118/78 | HR 84 | Temp 98.1°F | Resp 14 | Ht 62.0 in | Wt 145.1 lb

## 2020-02-28 DIAGNOSIS — I10 Essential (primary) hypertension: Secondary | ICD-10-CM | POA: Diagnosis not present

## 2020-02-28 DIAGNOSIS — E785 Hyperlipidemia, unspecified: Secondary | ICD-10-CM

## 2020-02-28 DIAGNOSIS — R7303 Prediabetes: Secondary | ICD-10-CM

## 2020-02-28 DIAGNOSIS — M1711 Unilateral primary osteoarthritis, right knee: Secondary | ICD-10-CM

## 2020-02-28 DIAGNOSIS — R739 Hyperglycemia, unspecified: Secondary | ICD-10-CM | POA: Diagnosis not present

## 2020-02-28 NOTE — Progress Notes (Deleted)
She helps out at constructions sites, they own a Architect business

## 2020-02-29 LAB — CBC WITH DIFFERENTIAL/PLATELET
Absolute Monocytes: 546 cells/uL (ref 200–950)
Basophils Absolute: 61 cells/uL (ref 0–200)
Basophils Relative: 1.2 %
Eosinophils Absolute: 112 cells/uL (ref 15–500)
Eosinophils Relative: 2.2 %
HCT: 36.7 % (ref 35.0–45.0)
Hemoglobin: 12.3 g/dL (ref 11.7–15.5)
Lymphs Abs: 1668 cells/uL (ref 850–3900)
MCH: 29.4 pg (ref 27.0–33.0)
MCHC: 33.5 g/dL (ref 32.0–36.0)
MCV: 87.8 fL (ref 80.0–100.0)
MPV: 9.3 fL (ref 7.5–12.5)
Monocytes Relative: 10.7 %
Neutro Abs: 2713 cells/uL (ref 1500–7800)
Neutrophils Relative %: 53.2 %
Platelets: 334 10*3/uL (ref 140–400)
RBC: 4.18 10*6/uL (ref 3.80–5.10)
RDW: 12.5 % (ref 11.0–15.0)
Total Lymphocyte: 32.7 %
WBC: 5.1 10*3/uL (ref 3.8–10.8)

## 2020-02-29 LAB — LIPID PANEL
Cholesterol: 138 mg/dL (ref <200)
HDL: 50 mg/dL (ref >=50)
LDL Cholesterol (Calc): 71 mg/dL (calc)
Non-HDL Cholesterol (Calc): 88 mg/dL (calc) (ref <130)
Total CHOL/HDL Ratio: 2.8 (calc) (ref <5.0)
Triglycerides: 89 mg/dL (ref <150)

## 2020-02-29 LAB — COMPLETE METABOLIC PANEL WITH GFR
AG Ratio: 1.5 (calc) (ref 1.0–2.5)
ALT: 30 U/L — ABNORMAL HIGH (ref 6–29)
AST: 35 U/L (ref 10–35)
Albumin: 4.3 g/dL (ref 3.6–5.1)
Alkaline phosphatase (APISO): 93 U/L (ref 37–153)
BUN/Creatinine Ratio: 23 (calc) — ABNORMAL HIGH (ref 6–22)
BUN: 28 mg/dL — ABNORMAL HIGH (ref 7–25)
CO2: 29 mmol/L (ref 20–32)
Calcium: 9.9 mg/dL (ref 8.6–10.4)
Chloride: 103 mmol/L (ref 98–110)
Creat: 1.23 mg/dL — ABNORMAL HIGH (ref 0.50–0.99)
GFR, Est African American: 53 mL/min/{1.73_m2} — ABNORMAL LOW (ref >=60)
GFR, Est Non African American: 45 mL/min/{1.73_m2} — ABNORMAL LOW (ref >=60)
Globulin: 2.9 g/dL (calc) (ref 1.9–3.7)
Glucose, Bld: 94 mg/dL (ref 65–99)
Potassium: 4.5 mmol/L (ref 3.5–5.3)
Sodium: 139 mmol/L (ref 135–146)
Total Bilirubin: 0.9 mg/dL (ref 0.2–1.2)
Total Protein: 7.2 g/dL (ref 6.1–8.1)

## 2020-02-29 LAB — HEMOGLOBIN A1C
Hgb A1c MFr Bld: 6.3 % of total Hgb — ABNORMAL HIGH (ref <5.7)
Mean Plasma Glucose: 134 (calc)
eAG (mmol/L): 7.4 (calc)

## 2020-05-29 ENCOUNTER — Other Ambulatory Visit: Payer: Self-pay | Admitting: Family Medicine

## 2020-05-29 DIAGNOSIS — E785 Hyperlipidemia, unspecified: Secondary | ICD-10-CM

## 2020-05-29 DIAGNOSIS — I10 Essential (primary) hypertension: Secondary | ICD-10-CM

## 2020-08-22 ENCOUNTER — Other Ambulatory Visit: Payer: Self-pay

## 2020-08-22 ENCOUNTER — Ambulatory Visit (INDEPENDENT_AMBULATORY_CARE_PROVIDER_SITE_OTHER): Payer: Medicare Other

## 2020-08-22 VITALS — BP 122/84 | HR 78 | Temp 98.2°F | Resp 16 | Ht 62.0 in | Wt 145.2 lb

## 2020-08-22 DIAGNOSIS — Z780 Asymptomatic menopausal state: Secondary | ICD-10-CM | POA: Diagnosis not present

## 2020-08-22 DIAGNOSIS — Z1231 Encounter for screening mammogram for malignant neoplasm of breast: Secondary | ICD-10-CM

## 2020-08-22 DIAGNOSIS — Z0000 Encounter for general adult medical examination without abnormal findings: Secondary | ICD-10-CM | POA: Diagnosis not present

## 2020-08-22 NOTE — Progress Notes (Signed)
Subjective:   Bianca Suarez is a 70 y.o. female who presents for Medicare Annual (Subsequent) preventive examination.  Review of Systems     Cardiac Risk Factors include: advanced age (>42men, >39 women);dyslipidemia;hypertension     Objective:    Today's Vitals   08/22/20 1011  BP: 122/84  Pulse: 78  Resp: 16  Temp: 98.2 F (36.8 C)  TempSrc: Oral  SpO2: 99%  Weight: 145 lb 3.2 oz (65.9 kg)  Height: 5\' 2"  (1.575 m)   Body mass index is 26.56 kg/m.  Advanced Directives 08/22/2020 08/19/2019 10/07/2016 06/11/2016 04/02/2016 10/05/2015 03/13/2015  Does Patient Have a Medical Advance Directive? No No No No No No No  Would patient like information on creating a medical advance directive? No - Patient declined Yes (MAU/Ambulatory/Procedural Areas - Information given) - - No - patient declined information No - patient declined information No - patient declined information    Current Medications (verified) Outpatient Encounter Medications as of 08/22/2020  Medication Sig  . amlodipine-atorvastatin (CADUET) 10-20 MG tablet Take 1 tablet by mouth once daily  . aspirin 81 MG tablet Take 1 tablet by mouth daily.  Marland Kitchen olmesartan-hydrochlorothiazide (BENICAR HCT) 40-25 MG tablet Take 1 tablet by mouth once daily   No facility-administered encounter medications on file as of 08/22/2020.    Allergies (verified) Patient has no known allergies.   History: Past Medical History:  Diagnosis Date  . Fibrocystic disease of left breast   . Glaucoma   . Hyperlipidemia   . Hypertension   . Lactose intolerance in adult   . Left lateral epicondylitis   . TIA (transient ischemic attack)    Past Surgical History:  Procedure Laterality Date  . ABDOMINAL HYSTERECTOMY  05/20/2002  . COSMETIC SURGERY  2001   lens replacement  . EYE SURGERY    . FOOT SURGERY Bilateral 09/29/2017   Bone Spurs Cut Down   Family History  Problem Relation Age of Onset  . Diabetes Mother   . Hypertension Mother    . Hypertension Father   . CVA Father   . Hypertension Sister   . Stroke Maternal Grandmother   . Heart disease Paternal Grandmother   . Diabetes Paternal Grandmother    Social History   Socioeconomic History  . Marital status: Married    Spouse name: Belenda Cruise  . Number of children: 3  . Years of education: Not on file  . Highest education level: Associate degree: academic program  Occupational History  . Not on file  Tobacco Use  . Smoking status: Never Smoker  . Smokeless tobacco: Never Used  Vaping Use  . Vaping Use: Never used  Substance and Sexual Activity  . Alcohol use: No    Alcohol/week: 0.0 standard drinks  . Drug use: No  . Sexual activity: Yes    Partners: Male    Birth control/protection: None    Comment: Hysterectomy-Total  Other Topics Concern  . 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 Difficulty of Paying Living Expenses: Not hard at all  Food Insecurity: No Food Insecurity  . Worried About Charity fundraiser in the Last Year: Never true  . Ran Out of Food in the Last Year: Never true  Transportation Needs: No Transportation Needs  .  Lack of Transportation (Medical): No  . Lack of Transportation (Non-Medical): No  Physical Activity: Inactive  . Days of Exercise per Week: 0 days  . Minutes of Exercise per Session: 0 min  Stress: No Stress Concern Present  . Feeling of Stress : Not at all  Social Connections: Socially Integrated  . Frequency of Communication with Friends and Family: More than three times a week  . Frequency of Social Gatherings with Friends and Family: More than three times a week  . Attends Religious Services: More than 4 times per year  . Active Member of Clubs or Organizations: Yes  . Attends Archivist Meetings: More than 4 times per  year  . Marital Status: Married    Tobacco Counseling Counseling given: Not Answered   Clinical Intake:  Pre-visit preparation completed: Yes  Pain : No/denies pain     BMI - recorded: 26.56 Nutritional Status: BMI 25 -29 Overweight Nutritional Risks: Nausea/ vomitting/ diarrhea (more frequent bowel movemenst) Diabetes: No  How often do you need to have someone help you when you read instructions, pamphlets, or other written materials from your doctor or pharmacy?: 1 - Never    Interpreter Needed?: No  Information entered by :: Clemetine Marker LPN   Activities of Daily Living In your present state of health, do you have any difficulty performing the following activities: 08/22/2020 02/28/2020  Hearing? N N  Comment declines hearing aids -  Vision? N N  Difficulty concentrating or making decisions? N N  Walking or climbing stairs? N N  Dressing or bathing? N N  Doing errands, shopping? N N  Preparing Food and eating ? N -  Using the Toilet? N -  In the past six months, have you accidently leaked urine? Y -  Do you have problems with loss of bowel control? Y -  Comment wears pads for protection when needed -  Managing your Medications? N -  Managing your Finances? N -  Housekeeping or managing your Housekeeping? N -  Some recent data might be hidden    Patient Care Team: Steele Sizer, MD as PCP - General (Family Medicine) Edrick Kins, DPM as Consulting Physician (Podiatry) Thelma Comp, [place] (Ophthalmology)  Indicate any recent Medical Services you may have received from other than [facility] providers in the past year (date may be approximate).     Assessment:   This is a routine wellness examination for [facility].  Hearing/Vision screen  Hearing Screening   125Hz  250Hz  500Hz  1000Hz  2000Hz  3000Hz  4000Hz  6000Hz  8000Hz   Right ear:           Left ear:           Comments: Pt denies hearing difficulty  Vision Screening Comments: [facility] screen[facility]  Dietary issues and exercise activities discussed: Current Exercise Habits: The patient has a physically strenuous job, but has no regular exercise apart from work., Exercise limited by: None identified  Goals    . Patient Stated     Patient states she would like to maintain overall health and blood pressure within normal limits.       Depression Screen PHQ 2/9 Scores 08/22/2020 02/28/2020 08/19/2019 08/09/2019 02/02/2019 07/22/2018 02/24/2018  PHQ - 2 Score 0 0 0 0 0 0 0  PHQ- 9 Score - - - 0 0 0 0    Fall Risk Fall Risk  08/22/2020 02/28/2020 08/19/2019 08/09/2019 02/02/2019  Falls in the past year? 0 0 0 0 0  Comment - - - - -  Number falls in past yr: 0 0 0 0 0  Injury with Fall? 0 0 0 0 0  Risk for fall due to : No Fall Risks - No Fall Risks - -  Follow up Falls prevention discussed - Falls prevention discussed - -    FALL RISK PREVENTION PERTAINING TO THE HOME:  Any stairs in or around the home? Yes  If so, are there any without handrails? No  - 2 steps outside Home free of loose throw rugs in walkways, pet beds, electrical cords, etc? Yes  Adequate lighting in your home to reduce risk of falls? Yes   ASSISTIVE DEVICES UTILIZED TO PREVENT FALLS:  Life alert? No  Use of a cane, walker or w/c? No  Grab bars in the bathroom? Yes  Shower chair or bench in shower? No  Elevated toilet seat or a handicapped toilet? Yes   TIMED UP AND GO:  Was the test performed? Yes .  Length of time to ambulate 10 feet: 4 sec.   Gait steady and fast without use of assistive device  Cognitive Function: Normal cognitive status assessed by direct observation by this Nurse Health Advisor. No abnormalities found.          Immunizations Immunization History  Administered Date(s) Administered  . Pneumococcal Conjugate-13 01/21/2018  . Pneumococcal Polysaccharide-23 02/09/2019  . Tdap 02/11/2011  . Zoster Recombinat (Shingrix) 10/07/2016, 06/20/2017    TDAP status: Up to  date  Flu Vaccine status: Declined, Education has been provided regarding the importance of this vaccine but patient still declined. Advised may receive this vaccine at local pharmacy or Health Dept. Aware to provide a copy of the vaccination record if obtained from local pharmacy or Health Dept. Verbalized acceptance and understanding.  Pneumococcal vaccine status: Up to date  Covid-19 vaccine status: Completed vaccines. Pt advised to bring copy of vaccine record to next appt   Qualifies for Shingles Vaccine? Yes   Zostavax completed No   Shingrix Completed?: Yes  Screening Tests Health Maintenance  Topic Date Due  . COVID-19 Vaccine (1) Never done  . MAMMOGRAM  04/06/2017  . DEXA SCAN  Never done  . INFLUENZA VACCINE  12/18/2020  . TETANUS/TDAP  02/10/2021  . COLONOSCOPY (Pts 45-20yrs Insurance coverage will need to be confirmed)  03/19/2021  . Hepatitis C Screening  Completed  . PNA vac Low Risk Adult  Completed  . HPV VACCINES  Aged Out    Health Maintenance  Health Maintenance Due  Topic Date Due  . COVID-19 Vaccine (1) Never done  . MAMMOGRAM  04/06/2017  . DEXA SCAN  Never done    Colorectal cancer screening: Type of screening: Colonoscopy. Completed 03/20/11. Repeat every 10 years  Mammogram status: Completed 04/07/15. Repeat every year. Ordered today.   Bone Density status: Ordered today. Pt provided with contact info and advised to call to schedule appt.  Lung Cancer Screening: (Low Dose CT Chest recommended if Age 39-80 years, 30 pack-year currently smoking OR have quit w/in 15years.) does not qualify.   Additional Screening:  Hepatitis C Screening: does qualify; Completed 09/14/13  Vision Screening: Recommended annual ophthalmology exams for early detection of glaucoma and other disorders of the eye. Is the patient up to date with their annual eye exam?  Yes  Who is the provider or what is the name of the office in which the patient attends annual eye  exams? [facility]  Dental Screening: Recommended annual dental  exams for proper oral hygiene  Community Resource Referral / Chronic Care Management: CRR required this visit?  No   CCM required this visit?  No      Plan:     I have personally reviewed and noted the following in the patient's chart:   . Medical and social history . Use of alcohol, tobacco or illicit drugs  . Current medications and supplements . Functional ability and status . Nutritional status . Physical activity . Advanced directives . List of other physicians . Hospitalizations, surgeries, and ER visits in previous 12 months . Vitals . Screenings to include cognitive, depression, and falls . Referrals and appointments  In addition, I have reviewed and discussed with patient certain preventive protocols, quality metrics, and best practice recommendations. A written personalized care plan for preventive services as well as general preventive health recommendations were provided to patient.     Clemetine Marker, LPN   07/20/1222   Nurse Notes: pt c/o increased frequency in bowel movements with occasional leakage and urgency. Pt states she has noticed certain foods such as peanuts tend to cause this & pt does have hx of intolerance to peanut oil. Pt plans to discuss with Dr. Ancil Boozer at appt on 08/28/20 and will be due for colonoscopy later this year.

## 2020-08-22 NOTE — Patient Instructions (Signed)
Bianca Suarez , Thank you for taking time to come for your Medicare Wellness Visit. I appreciate your ongoing commitment to your health goals. Please review the following plan we discussed and let me know if I can assist you in the future.   Screening recommendations/referrals: Colonoscopy: done 03/20/11. Repeat 02/2021 Mammogram: done 04/07/15. Please call [phone] to schedule your mammogram and bone density screening.  Recommended yearly ophthalmology/optometry visit for glaucoma screening and checkup Recommended yearly dental visit for hygiene and checkup  Vaccinations: Influenza vaccine: declined Pneumococcal vaccine: done 02/09/19 Tdap vaccine: done 02/11/11 Shingles vaccine: done 10/07/16 & 06/20/17   Covid-19: please bring a copy of your vaccination record with you to your next appt   Advanced directives: Advance directive discussed with you today. Even though you declined this today please call our office should you change your mind and we can give you the proper paperwork for you to fill out.  Conditions/risks identified: Keep up the great work!  Next appointment: Follow up in one year for your annual wellness visit    Preventive Care 65 Years and Older, Female Preventive care refers to lifestyle choices and visits with your health care provider that can promote health and wellness. What does preventive care include?  A yearly physical exam. This is also called an annual well check.  Dental exams once or twice a year.  Routine eye exams. Ask your health care provider how often you should have your eyes checked.  Personal lifestyle choices, including:  Daily care of your teeth and gums.  Regular physical activity.  Eating a healthy diet.  Avoiding tobacco and drug use.  Limiting alcohol use.  Practicing safe sex.  Taking low-dose aspirin every day.  Taking vitamin and mineral supplements as recommended by your health care provider. What happens during an annual  well check? The services and screenings done by your health care provider during your annual well check will depend on your age, overall health, lifestyle risk factors, and family history of disease. Counseling  Your health care provider may ask you questions about your:  Alcohol use.  Tobacco use.  Drug use.  Emotional well-being.  Home and relationship well-being.  Sexual activity.  Eating habits.  History of falls.  Memory and ability to understand (cognition).  Work and work Statistician.  Reproductive health. Screening  You may have the following tests or measurements:  Height, weight, and BMI.  Blood pressure.  Lipid and cholesterol levels. These may be checked every 5 years, or more frequently if you are over 36 years old.  Skin check.  Lung cancer screening. You may have this screening every year starting at age 21 if you have a 30-pack-year history of smoking and currently smoke or have quit within the past 15 years.  Fecal occult blood test (FOBT) of the stool. You may have this test every year starting at age 1.  Flexible sigmoidoscopy or colonoscopy. You may have a sigmoidoscopy every 5 years or a colonoscopy every 10 years starting at age 10.  Hepatitis C blood test.  Hepatitis B blood test.  Sexually transmitted disease (STD) testing.  Diabetes screening. This is done by checking your blood sugar (glucose) after you have not eaten for a while (fasting). You may have this done every 1-3 years.  Bone density scan. This is done to screen for osteoporosis. You may have this done starting at age 90.  Mammogram. This may be done every 1-2 years. Talk to your health care provider about how  often you should have regular mammograms. Talk with your health care provider about your test results, treatment options, and if necessary, the need for more tests. Vaccines  Your health care provider may recommend certain vaccines, such as:  Influenza vaccine. This  is recommended every year.  Tetanus, diphtheria, and acellular pertussis (Tdap, Td) vaccine. You may need a Td booster every 10 years.  Zoster vaccine. You may need this after age 75.  Pneumococcal 13-valent conjugate (PCV13) vaccine. One dose is recommended after age 24.  Pneumococcal polysaccharide (PPSV23) vaccine. One dose is recommended after age 68. Talk to your health care provider about which screenings and vaccines you need and how often you need them. This information is not intended to replace advice given to you by your health care provider. Make sure you discuss any questions you have with your health care provider. Document Released: 06/02/2015 Document Revised: 01/24/2016 Document Reviewed: 03/07/2015 Elsevier Interactive Patient Education  2017 [place] Prevention in the Home Falls can cause injuries. They can happen to people of all ages. There are many things you can do to make your home safe and to help prevent falls. What can I do on the outside of my home?  Regularly fix the edges of walkways and driveways and fix any cracks.  Remove anything that might make you trip as you walk through a door, such as a raised step or threshold.  Trim any bushes or trees on the path to your home.  Use bright outdoor lighting.  Clear any walking paths of anything that might make someone trip, such as rocks or tools.  Regularly check to see if handrails are loose or broken. Make sure that both sides of any steps have handrails.  Any raised decks and porches should have guardrails on the edges.  Have any leaves, snow, or ice cleared regularly.  Use sand or salt on walking paths during winter.  Clean up any spills in your garage right away. This includes oil or grease spills. What can I do in the bathroom?  Use night lights.  Install grab bars by the toilet and in the tub and shower. Do not use towel bars as grab bars.  Use non-skid mats or decals in the tub or  shower.  If you need to sit down in the shower, use a plastic, non-slip stool.  Keep the floor dry. Clean up any water that spills on the floor as soon as it happens.  Remove soap buildup in the tub or shower regularly.  Attach bath mats securely with double-sided non-slip rug tape.  Do not have throw rugs and other things on the floor that can make you trip. What can I do in the bedroom?  Use night lights.  Make sure that you have a light by your bed that is easy to reach.  Do not use any sheets or blankets that are too big for your bed. They should not hang down onto the floor.  Have a firm chair that has side arms. You can use this for support while you get dressed.  Do not have throw rugs and other things on the floor that can make you trip. What can I do in the kitchen?  Clean up any spills right away.  Avoid walking on wet floors.  Keep items that you use a lot in easy-to-reach places.  If you need to reach something above you, use a strong step stool that has a grab bar.  Keep electrical  cords out of the way.  Do not use floor polish or wax that makes floors slippery. If you must use wax, use non-skid floor wax.  Do not have throw rugs and other things on the floor that can make you trip. What can I do with my stairs?  Do not leave any items on the stairs.  Make sure that there are handrails on both sides of the stairs and use them. Fix handrails that are broken or loose. Make sure that handrails are as long as the stairways.  Check any carpeting to make sure that it is firmly attached to the stairs. Fix any carpet that is loose or worn.  Avoid having throw rugs at the top or bottom of the stairs. If you do have throw rugs, attach them to the floor with carpet tape.  Make sure that you have a light switch at the top of the stairs and the bottom of the stairs. If you do not have them, ask someone to add them for you. What else can I do to help prevent  falls?  Wear shoes that:  Do not have high heels.  Have rubber bottoms.  Are comfortable and fit you well.  Are closed at the toe. Do not wear sandals.  If you use a stepladder:  Make sure that it is fully opened. Do not climb a closed stepladder.  Make sure that both sides of the stepladder are locked into place.  Ask someone to hold it for you, if possible.  Clearly mark and make sure that you can see:  Any grab bars or handrails.  First and last steps.  Where the edge of each step is.  Use tools that help you move around (mobility aids) if they are needed. These include:  Canes.  Walkers.  Scooters.  Crutches.  Turn on the lights when you go into a dark area. Replace any light bulbs as soon as they burn out.  Set up your furniture so you have a clear path. Avoid moving your furniture around.  If any of your floors are uneven, fix them.  If there are any pets around you, be aware of where they are.  Review your medicines with your doctor. Some medicines can make you feel dizzy. This can increase your chance of falling. Ask your doctor what other things that you can do to help prevent falls. This information is not intended to replace advice given to you by your health care provider. Make sure you discuss any questions you have with your health care provider. Document Released: 03/02/2009 Document Revised: 10/12/2015 Document Reviewed: 06/10/2014 Elsevier Interactive Patient Education  2017 [employer].

## 2020-08-25 NOTE — Progress Notes (Signed)
Name: Bianca Suarez   MRN: [id number]    DOB: 12/30/1950   Date:08/28/2020       Progress Note  Subjective  Chief Complaint  Follow Up  HPI  HTNand hyperlipidemia:taking medication as prescribed, she denies side effects. No chest pain or palpitation. BP at home within normal limits. No longer getting dizzy   Pre-diabetes: A1C has been stable between 6.2% and 6.3 %  She denies polyphagia, polydipsia or polyuria. Discussed importance of life style modification.She is still drinking sodas, but now only one small can per day, trying to drink more water, but still uses sugar on her coffee. She likes potatoes, but is cutting down on pasta and breads  Hyperlipidemia: reviewed labs with patient LDL is at goal 71, HDL also at goal at 50. Normal triglycerides, she is on Caduet no side effects.Doing well She has a history of TIA but no recent problems   OA: she states right knee no longer swelling but still has instability,  feels like knee will give out, no falls, she has noticed some thigh pain lately also, pain is described as throbbing like, usually with activity and right worse than left. Worse when squatting and getting up afterwards . Seems to be getting worse over the past 6 months. Discussed referral to Ortho but she wants to off for now.   Senile purpura: she states intermittent bruises , reassurance given  Loose stools: over the past 5 months  she has noticed that stools are not as firm, goes from [place] 4 to 5 . She denies change in diet, no blood or mucus in stools, weight is normal. Sometimes has urgency and had some bowel incontinence a couple of times a month , we will refer her to GI now . Due for colonoscopy this year  Patient Active Problem List   Diagnosis Date Noted  . Pre-diabetes 07/22/2018  . Hyperglycemia 01/21/2018  . Dyslipidemia 12/24/2014  . Fibrocystic breast disease 12/24/2014  . 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 H/O transient cerebral ischemia 12/24/2014  . Benign hypertension 12/24/2014  . Lateral epicondylitis of left elbow 12/24/2014    Past Surgical History:  Procedure Laterality Date  . ABDOMINAL HYSTERECTOMY  05/20/2002  . COSMETIC SURGERY  2001   lens replacement  . EYE SURGERY    . FOOT SURGERY Bilateral 09/29/2017   Bone Spurs Cut Down    Family History  Problem Relation Age of Onset  . Diabetes Mother   . Hypertension Mother   . Hypertension Father   . CVA Father   . Hypertension Sister   . Stroke Maternal Grandmother   . Heart disease Paternal Grandmother   . Diabetes Paternal Grandmother     Social History   Tobacco Use  . Smoking status: Never Smoker  . Smokeless tobacco: Never Used  Substance Use Topics  . Alcohol use: No    Alcohol/week: 0.0 standard drinks     Current Outpatient Medications:  .  amlodipine-atorvastatin (CADUET) 10-20 MG tablet, Take 1 tablet by mouth once daily, Disp: 90 tablet, Rfl: 0 .  aspirin 81 MG tablet, Take 1 tablet by mouth daily., Disp: , Rfl:  .  olmesartan-hydrochlorothiazide (BENICAR HCT) 40-25 MG tablet, Take 1 tablet by mouth once daily, Disp: 90 tablet, Rfl: 0  No Known Allergies  I personally reviewed active problem list, medication list, allergies, family history, social history, health maintenance with the patient/caregiver today.   ROS  Constitutional: Negative  for fever or weight change.  Respiratory: Negative for cough and shortness of breath.   Cardiovascular: Negative for chest pain or palpitations.  Gastrointestinal: Negative for abdominal pain, no bowel changes.  Musculoskeletal: Positive for gait problem or joint swelling.  Skin: Negative for rash.  Neurological: Negative for dizziness or headache.  No other specific complaints in a complete review of systems (except as listed in HPI above).  Objective  Vitals:   08/28/20 0956  BP: 118/80  Pulse: 88  Resp: 16  Temp: 98.2 F (36.8 C)   TempSrc: Oral  SpO2: 98%  Weight: 143 lb (64.9 kg)  Height: 5\' 2"  (1.575 m)    Body mass index is 26.16 kg/m.  Physical Exam  Constitutional: Patient appears well-developed and well-nourished.  No distress.  HEENT: head atraumatic, normocephalic, pupils equal and reactive to light, neck supple Cardiovascular: Normal rate, regular rhythm and normal heart sounds.  No murmur heard. No BLE edema. Pulmonary/Chest: Effort normal and breath sounds normal. No respiratory distress. Abdominal: Soft.  There is no tenderness. Muscular skeletal: normal knee exam  Psychiatric: Patient has a normal mood and affect. behavior is normal. Judgment and thought content normal.  PHQ2/9: Depression screen [facility] 2/9 08/28/2020 08/22/2020 02/28/2020 08/19/2019 08/09/2019  Decreased Interest 0 0 0 0 0  Down, Depressed, Hopeless 0 0 0 0 0  PHQ - 2 Score 0 0 0 0 0  Altered sleeping - - - - 0  Tired, decreased energy - - - - 0  Change in appetite - - - - 0  Feeling bad or failure about yourself  - - - - 0  Trouble concentrating - - - - 0  Moving slowly or fidgety/restless - - - - 0  Suicidal thoughts - - - - 0  PHQ-9 Score - - - - 0  Difficult doing work/chores - - - - -    phq 9 is negative   Fall Risk: Fall Risk  08/28/2020 08/22/2020 02/28/2020 08/19/2019 08/09/2019  Falls in the past year? 0 0 0 0 0  Comment - - - - -  Number falls in past yr: 0 0 0 0 0  Injury with Fall? 0 0 0 0 0  Risk for fall due to : - No Fall Risks - No Fall Risks -  Follow up - Falls prevention discussed - Falls prevention discussed -     Functional Status Survey: Is the patient deaf or have difficulty hearing?: No Does the patient have difficulty seeing, even when wearing glasses/contacts?: No Does the patient have difficulty concentrating, remembering, or making decisions?: No Does the patient have difficulty walking or climbing stairs?: No Does the patient have difficulty dressing or bathing?: No Does the patient have  difficulty doing errands alone such as visiting a doctor's office or shopping?: No    Assessment & Plan  1. Pre-diabetes  Repeat A1C next visit   2. Hypertension, benign  - olmesartan-hydrochlorothiazide (BENICAR HCT) 40-25 MG tablet; Take 1 tablet by mouth daily.  Dispense: 90 tablet; Refill: 1 - amlodipine-atorvastatin (CADUET) 10-20 MG tablet; Take 1 tablet by mouth daily.  Dispense: 90 tablet; Refill: 1  3. Dyslipidemia  - amlodipine-atorvastatin (CADUET) 10-20 MG tablet; Take 1 tablet by mouth daily.  Dispense: 90 tablet; Refill: 1  4. Primary osteoarthritis of right knee  Having some quad weakness/pain, giving her some exercises to do at home  5. Hyperglycemia  Reviewed a low sugar diet    6. Senile purpura ([place])  Reassurance given  7. Incontinence of feces, unspecified fecal incontinence type  - Ambulatory referral to Gastroenterology  8. Change in bowel movement  - Ambulatory referral to Gastroenterology

## 2020-08-28 ENCOUNTER — Encounter: Payer: Self-pay | Admitting: Family Medicine

## 2020-08-28 ENCOUNTER — Other Ambulatory Visit: Payer: Self-pay

## 2020-08-28 ENCOUNTER — Ambulatory Visit (INDEPENDENT_AMBULATORY_CARE_PROVIDER_SITE_OTHER): Payer: Medicare Other | Admitting: Family Medicine

## 2020-08-28 VITALS — BP 118/80 | HR 88 | Temp 98.2°F | Resp 16 | Ht 62.0 in | Wt 143.0 lb

## 2020-08-28 DIAGNOSIS — I10 Essential (primary) hypertension: Secondary | ICD-10-CM | POA: Diagnosis not present

## 2020-08-28 DIAGNOSIS — R7303 Prediabetes: Secondary | ICD-10-CM

## 2020-08-28 DIAGNOSIS — M1711 Unilateral primary osteoarthritis, right knee: Secondary | ICD-10-CM

## 2020-08-28 DIAGNOSIS — D692 Other nonthrombocytopenic purpura: Secondary | ICD-10-CM

## 2020-08-28 DIAGNOSIS — R739 Hyperglycemia, unspecified: Secondary | ICD-10-CM

## 2020-08-28 DIAGNOSIS — E785 Hyperlipidemia, unspecified: Secondary | ICD-10-CM

## 2020-08-28 DIAGNOSIS — R198 Other specified symptoms and signs involving the digestive system and abdomen: Secondary | ICD-10-CM

## 2020-08-28 DIAGNOSIS — R159 Full incontinence of feces: Secondary | ICD-10-CM

## 2020-08-28 MED ORDER — AMLODIPINE-ATORVASTATIN 10-20 MG PO TABS: 1.0000 | ORAL_TABLET | Freq: Every day | ORAL | 1 refills | Status: DC

## 2020-08-28 MED ORDER — OLMESARTAN MEDOXOMIL-HCTZ 40-25 MG PO TABS: 1.0000 | ORAL_TABLET | Freq: Every day | ORAL | 1 refills | Status: DC

## 2020-08-28 NOTE — Patient Instructions (Signed)
Journal for Nurse Practitioners, 15(4), 263-267. Retrieved February 23, 2018 from http://clinicalkey.com/nursing">  Knee Exercises Ask your health care provider which exercises are safe for you. Do exercises exactly as told by your health care provider and adjust them as directed. It is normal to feel mild stretching, pulling, tightness, or discomfort as you do these exercises. Stop right away if you feel sudden pain or your pain gets worse. Do not begin these exercises until told by your health care provider. Stretching and range-of-motion exercises These exercises warm up your muscles and joints and improve the movement and flexibility of your knee. These exercises also help to relieve pain and swelling. Knee extension, prone 1. Lie on your abdomen (prone position) on a bed. 2. Place your left / right knee just beyond the edge of the surface so your knee is not on the bed. You can put a towel under your left / right thigh just above your kneecap for comfort. 3. Relax your leg muscles and allow gravity to straighten your knee (extension). You should feel a stretch behind your left / right knee. 4. Hold this position for __________ seconds. 5. Scoot up so your knee is supported between repetitions. Repeat __________ times. Complete this exercise __________ times a day. Knee flexion, active 1. Lie on your back with both legs straight. If this causes back discomfort, bend your left / right knee so your foot is flat on the floor. 2. Slowly slide your left / right heel back toward your buttocks. Stop when you feel a gentle stretch in the front of your knee or thigh (flexion). 3. Hold this position for __________ seconds. 4. Slowly slide your left / right heel back to the starting position. Repeat __________ times. Complete this exercise __________ times a day.   Quadriceps stretch, prone 1. Lie on your abdomen on a firm surface, such as a bed or padded floor. 2. Bend your left / right knee and hold  your ankle. If you cannot reach your ankle or pant leg, loop a belt around your foot and grab the belt instead. 3. Gently pull your heel toward your buttocks. Your knee should not slide out to the side. You should feel a stretch in the front of your thigh and knee (quadriceps). 4. Hold this position for __________ seconds. Repeat __________ times. Complete this exercise __________ times a day.   Hamstring, supine 1. Lie on your back (supine position). 2. Loop a belt or towel over the ball of your left / right foot. The ball of your foot is on the walking surface, right under your toes. 3. Straighten your left / right knee and slowly pull on the belt to raise your leg until you feel a gentle stretch behind your knee (hamstring). ? Do not let your knee bend while you do this. ? Keep your other leg flat on the floor. 4. Hold this position for __________ seconds. Repeat __________ times. Complete this exercise __________ times a day. Strengthening exercises These exercises build strength and endurance in your knee. Endurance is the ability to use your muscles for a long time, even after they get tired. Quadriceps, isometric This exercise stretches the muscles in front of your thigh (quadriceps) without moving your knee joint (isometric). 1. Lie on your back with your left / right leg extended and your other knee bent. Put a rolled towel or small pillow under your knee if told by your health care provider. 2. Slowly tense the muscles in the front of your   left / right thigh. You should see your kneecap slide up toward your hip or see increased dimpling just above the knee. This motion will push the back of the knee toward the floor. 3. For __________ seconds, hold the muscle as tight as you can without increasing your pain. 4. Relax the muscles slowly and completely. Repeat __________ times. Complete this exercise __________ times a day.   Straight leg raises This exercise stretches the muscles in  front of your thigh (quadriceps) and the muscles that move your hips (hip flexors). 1. Lie on your back with your left / right leg extended and your other knee bent. 2. Tense the muscles in the front of your left / right thigh. You should see your kneecap slide up or see increased dimpling just above the knee. Your thigh may even shake a bit. 3. Keep these muscles tight as you raise your leg 4-6 inches (10-15 cm) off the floor. Do not let your knee bend. 4. Hold this position for __________ seconds. 5. Keep these muscles tense as you lower your leg. 6. Relax your muscles slowly and completely after each repetition. Repeat __________ times. Complete this exercise __________ times a day. Hamstring, isometric 1. Lie on your back on a firm surface. 2. Bend your left / right knee about __________ degrees. 3. Dig your left / right heel into the surface as if you are trying to pull it toward your buttocks. Tighten the muscles in the back of your thighs (hamstring) to "dig" as hard as you can without increasing any pain. 4. Hold this position for __________ seconds. 5. Release the tension gradually and allow your muscles to relax completely for __________ seconds after each repetition. Repeat __________ times. Complete this exercise __________ times a day. Hamstring curls If told by your health care provider, do this exercise while wearing ankle weights. Begin with __________ lb weights. Then increase the weight by 1 lb (0.5 kg) increments. Do not wear ankle weights that are more than __________ lb. 1. Lie on your abdomen with your legs straight. 2. Bend your left / right knee as far as you can without feeling pain. Keep your hips flat against the floor. 3. Hold this position for __________ seconds. 4. Slowly lower your leg to the starting position. Repeat __________ times. Complete this exercise __________ times a day.   Squats This exercise strengthens the muscles in front of your thigh and knee  (quadriceps). 1. Stand in front of a table, with your feet and knees pointing straight ahead. You may rest your hands on the table for balance but not for support. 2. Slowly bend your knees and lower your hips like you are going to sit in a chair. ? Keep your weight over your heels, not over your toes. ? Keep your lower legs upright so they are parallel with the table legs. ? Do not let your hips go lower than your knees. ? Do not bend lower than told by your health care provider. ? If your knee pain increases, do not bend as low. 3. Hold the squat position for __________ seconds. 4. Slowly push with your legs to return to standing. Do not use your hands to pull yourself to standing. Repeat __________ times. Complete this exercise __________ times a day. Wall slides This exercise strengthens the muscles in front of your thigh and knee (quadriceps). 1. Lean your back against a smooth wall or door, and walk your feet out 18-24 inches (46-61 cm) from it. 2.   Place your feet hip-width apart. 3. Slowly slide down the wall or door until your knees bend __________ degrees. Keep your knees over your heels, not over your toes. Keep your knees in line with your hips. 4. Hold this position for __________ seconds. Repeat __________ times. Complete this exercise __________ times a day.   Straight leg raises This exercise strengthens the muscles that rotate the leg at the hip and move it away from your body (hip abductors). 1. Lie on your side with your left / right leg in the top position. Lie so your head, shoulder, knee, and hip line up. You may bend your bottom knee to help you keep your balance. 2. Roll your hips slightly forward so your hips are stacked directly over each other and your left / right knee is facing forward. 3. Leading with your heel, lift your top leg 4-6 inches (10-15 cm). You should feel the muscles in your outer hip lifting. ? Do not let your foot drift forward. ? Do not let your  knee roll toward the ceiling. 4. Hold this position for __________ seconds. 5. Slowly return your leg to the starting position. 6. Let your muscles relax completely after each repetition. Repeat __________ times. Complete this exercise __________ times a day.   Straight leg raises This exercise stretches the muscles that move your hips away from the front of the pelvis (hip extensors). 1. Lie on your abdomen on a firm surface. You can put a pillow under your hips if that is more comfortable. 2. Tense the muscles in your buttocks and lift your left / right leg about 4-6 inches (10-15 cm). Keep your knee straight as you lift your leg. 3. Hold this position for __________ seconds. 4. Slowly lower your leg to the starting position. 5. Let your leg relax completely after each repetition. Repeat __________ times. Complete this exercise __________ times a day. This information is not intended to replace advice given to you by your health care provider. Make sure you discuss any questions you have with your health care provider. Document Revised: 02/24/2018 Document Reviewed: 02/24/2018 Elsevier Patient Education  2021 [employer].  

## 2020-08-29 ENCOUNTER — Encounter: Payer: Self-pay | Admitting: *Deleted

## 2020-10-09 ENCOUNTER — Other Ambulatory Visit: Payer: Self-pay

## 2020-10-09 ENCOUNTER — Encounter: Payer: Self-pay | Admitting: Gastroenterology

## 2020-10-09 ENCOUNTER — Telehealth: Payer: Self-pay | Admitting: Gastroenterology

## 2020-10-09 ENCOUNTER — Ambulatory Visit: Payer: Medicare Other | Admitting: Gastroenterology

## 2020-10-09 VITALS — BP 159/84 | HR 99 | Temp 98.2°F | Ht 62.0 in | Wt 144.0 lb

## 2020-10-09 DIAGNOSIS — K529 Noninfective gastroenteritis and colitis, unspecified: Secondary | ICD-10-CM | POA: Diagnosis not present

## 2020-10-09 MED ORDER — NA SULFATE-K SULFATE-MG SULF 17.5-3.13-1.6 GM/177ML PO SOLN: 354.0000 mL | Freq: Once | ORAL | 0 refills | Status: AC

## 2020-10-09 NOTE — Telephone Encounter (Signed)
Informed patient her procedure was 10/12/2020 not June. She verbalized understanding

## 2020-10-09 NOTE — Progress Notes (Signed)
Bianca Darby, MD [address]  [place]  [place], [place] [address]  Main: [phone]  Fax: [phone]    Gastroenterology Consultation  Referring Provider:     Steele Sizer, MD Primary Care Physician:  Bianca Sizer, MD Primary Gastroenterologist:  Dr. Cephas Suarez Reason for Consultation:     Chronic diarrhea, fecal incontinence        HPI:   Bianca Suarez is a 70 y.o. female referred by Dr. Steele Sizer, MD  for consultation & management of chronic diarrhea, fecal incontinence  Loose stools: over the past 6-7 months  she has noticed that stools are not as firm, goes from Bianca Suarez 4 to 5, about 5 times daily, few days a week. She denies change in diet, no blood or mucus in stools, weight is normal. Sometimes has urgency and had some bowel incontinence a couple of times a month , patient thinks her symptoms have been progressively worsening.  She denies any nocturnal diarrhea.  She denies rectal bleeding, abdominal pain.  She does report abdominal bloating.  No evidence of anemia, TSH normal.  Due for colonoscopy this year Patient has been on Benicar for several years along with amlodipine and statin for her high blood pressure.  Patient reports that she did notice her stools turning loose when she started Benicar but over the last 6 months, she is experiencing diarrhea  Patient does not smoke or drink alcohol  NSAIDs: None  Antiplts/Anticoagulants/Anti thrombotics: None  GI Procedures:  Colonoscopy 2012 normal  Past Medical History:  Diagnosis Date  . Fibrocystic disease of left breast   . Glaucoma   . Hyperlipidemia   . Hypertension   . Lactose intolerance in adult   . Left lateral epicondylitis   . TIA (transient ischemic attack)     Past Surgical History:  Procedure Laterality Date  . ABDOMINAL HYSTERECTOMY  05/20/2002  . COSMETIC SURGERY  2001   lens replacement  . EYE SURGERY    . FOOT SURGERY Bilateral 09/29/2017   Bone Spurs Cut  Down    Current Outpatient Medications:  .  amlodipine-atorvastatin (CADUET) 10-20 MG tablet, Take 1 tablet by mouth daily., Disp: 90 tablet, Rfl: 1 .  aspirin 81 MG tablet, Take 1 tablet by mouth daily., Disp: , Rfl:  .  Na Sulfate-K Sulfate-Mg Sulf 17.5-3.13-1.6 GM/177ML SOLN, Take 354 mLs by mouth once for 1 dose., Disp: 354 mL, Rfl: 0 .  olmesartan-hydrochlorothiazide (BENICAR HCT) 40-25 MG tablet, Take 1 tablet by mouth daily., Disp: 90 tablet, Rfl: 1   Family History  Problem Relation Age of Onset  . Diabetes Mother   . Hypertension Mother   . Hypertension Father   . CVA Father   . Hypertension Sister   . Stroke Maternal Grandmother   . Heart disease Paternal Grandmother   . Diabetes Paternal Grandmother      Social History   Tobacco Use  . Smoking status: Never Smoker  . Smokeless tobacco: Never Used  Vaping Use  . Vaping Use: Never used  Substance Use Topics  . Alcohol use: No    Alcohol/week: 0.0 standard drinks  . Drug use: No    Allergies as of 10/09/2020  . (No Known Allergies)    Review of Systems:    All systems reviewed and negative except where noted in HPI.   Physical Exam:  BP (!) 159/84 (BP Location: Left Arm, Patient Position: Sitting, Cuff Size: Normal)   Pulse 99   Temp 98.2  F (36.8 C) (Oral)   Ht 5\' 2"  (1.575 m)   Wt 144 lb (65.3 kg)   BMI 26.34 kg/m  No LMP recorded. Patient has had a hysterectomy.  General:   Alert,  Well-developed, well-nourished, pleasant and cooperative in NAD Head:  Normocephalic and atraumatic. Eyes:  Sclera clear, no icterus.   Conjunctiva pink. Ears:  Normal auditory acuity. Nose:  No deformity, discharge, or lesions. Mouth:  No deformity or lesions,oropharynx pink & moist. Neck:  Supple; no masses or thyromegaly. Lungs:  Respirations even and unlabored.  Clear throughout to auscultation.   No wheezes, crackles, or rhonchi. No acute distress. Heart:  Regular rate and rhythm; no murmurs, clicks, rubs, or  gallops. Abdomen:  Normal bowel sounds. Soft, non-tender and moderately distended, tympanic to percussion without masses, hepatosplenomegaly or hernias noted.  No guarding or rebound tenderness.   Rectal: Not performed Msk:  Symmetrical without gross deformities. Good, equal movement & strength bilaterally. Pulses:  Normal pulses noted. Extremities:  No clubbing or edema.  No cyanosis. Neurologic:  Alert and oriented x3;  grossly normal neurologically. Skin:  Intact without significant lesions or rashes. No jaundice. Psych:  Alert and cooperative. Normal mood and affect.  Imaging Studies: No abdominal imaging  Assessment and Plan:   Bianca Suarez is a 70 y.o. female with history of hypertension is seen in consultation for chronic nonbloody diarrhea, intermittent fecal incontinence.  TSH normal with no evidence of anemia, no weight loss Differentials include olmesartan induced enteropathy or microscopic colitis or pancreatic insufficiency or less likely IBD Recommend stool studies to rule out infection Check pancreatic fecal elastase levels Recommend upper endoscopy with gastric and duodenal biopsies as well as colonoscopy with TI evaluation and colon biopsies Recommend to take Imodium approved to be small as needed for diarrhea  Follow up in 2 to 3 months   Bianca Darby, MD

## 2020-10-09 NOTE — Telephone Encounter (Signed)
Patient is unsure on procedure dates, patient stated that she needs clarification, paperwork has two different dates, per patient.

## 2020-10-10 ENCOUNTER — Encounter: Payer: Self-pay | Admitting: Gastroenterology

## 2020-10-10 ENCOUNTER — Telehealth: Payer: Self-pay | Admitting: Gastroenterology

## 2020-10-10 DIAGNOSIS — K529 Noninfective gastroenteritis and colitis, unspecified: Secondary | ICD-10-CM | POA: Diagnosis not present

## 2020-10-10 MED ORDER — GOLYTELY 236 G PO SOLR: 4000.0000 mL | Freq: Once | ORAL | 0 refills | Status: AC

## 2020-10-10 NOTE — Telephone Encounter (Signed)
Sent medication for Golytely to the pharmacy per patient request. Gave patient the instructions for prep to the paper

## 2020-10-10 NOTE — Telephone Encounter (Signed)
Patient wants to know if there is any other affordable options for the prep medication,her pharmacy is saying it'll cost her $108, per patient. Clinical staff will follow up with patient.

## 2020-10-11 NOTE — Discharge Instructions (Signed)

## 2020-10-12 ENCOUNTER — Other Ambulatory Visit: Payer: Self-pay

## 2020-10-12 ENCOUNTER — Ambulatory Visit
Admission: RE | Admit: 2020-10-12 | Discharge: 2020-10-12 | Disposition: A | Discharge status: Home / Self Care | Payer: Medicare Other | Attending: Gastroenterology | Admitting: Gastroenterology

## 2020-10-12 ENCOUNTER — Encounter: Payer: Self-pay | Admitting: Gastroenterology

## 2020-10-12 ENCOUNTER — Ambulatory Visit: Payer: Medicare Other | Admitting: Anesthesiology

## 2020-10-12 ENCOUNTER — Encounter
Admission: RE | Disposition: A | Discharge status: Home / Self Care | Payer: Self-pay | Source: Home / Self Care | Attending: Gastroenterology

## 2020-10-12 DIAGNOSIS — R197 Diarrhea, unspecified: Secondary | ICD-10-CM | POA: Diagnosis not present

## 2020-10-12 DIAGNOSIS — Z79899 Other long term (current) drug therapy: Secondary | ICD-10-CM | POA: Insufficient documentation

## 2020-10-12 DIAGNOSIS — K222 Esophageal obstruction: Secondary | ICD-10-CM | POA: Insufficient documentation

## 2020-10-12 DIAGNOSIS — K635 Polyp of colon: Secondary | ICD-10-CM | POA: Diagnosis not present

## 2020-10-12 DIAGNOSIS — D124 Benign neoplasm of descending colon: Secondary | ICD-10-CM | POA: Diagnosis not present

## 2020-10-12 DIAGNOSIS — Z7982 Long term (current) use of aspirin: Secondary | ICD-10-CM | POA: Insufficient documentation

## 2020-10-12 DIAGNOSIS — K2980 Duodenitis without bleeding: Secondary | ICD-10-CM | POA: Insufficient documentation

## 2020-10-12 DIAGNOSIS — K529 Noninfective gastroenteritis and colitis, unspecified: Secondary | ICD-10-CM

## 2020-10-12 HISTORY — PX: POLYPECTOMY: SHX5525

## 2020-10-12 HISTORY — PX: COLONOSCOPY WITH PROPOFOL: SHX5780

## 2020-10-12 HISTORY — PX: ESOPHAGOGASTRODUODENOSCOPY (EGD) WITH PROPOFOL: SHX5813

## 2020-10-12 HISTORY — PX: BIOPSY: SHX5522

## 2020-10-12 SURGERY — COLONOSCOPY WITH PROPOFOL
Anesthesia: General | Site: Rectum

## 2020-10-12 MED ORDER — STERILE WATER FOR IRRIGATION IR SOLN
Status: DC | PRN
  Administered 2020-10-12: 100 mL

## 2020-10-12 MED ORDER — SODIUM CHLORIDE 0.9 % IV SOLN: INTRAVENOUS | Status: DC

## 2020-10-12 MED ORDER — ONDANSETRON HCL 4 MG/2ML IJ SOLN: 4.0000 mg | Freq: Once | INTRAMUSCULAR | Status: DC | PRN

## 2020-10-12 MED ORDER — PROPOFOL 10 MG/ML IV BOLUS
INTRAVENOUS | Status: DC | PRN
  Administered 2020-10-12: 30 mg via INTRAVENOUS
  Administered 2020-10-12 (×2): 20 mg via INTRAVENOUS
  Administered 2020-10-12: 30 mg via INTRAVENOUS
  Administered 2020-10-12: 20 mg via INTRAVENOUS
  Administered 2020-10-12: 100 mg via INTRAVENOUS
  Administered 2020-10-12: 20 mg via INTRAVENOUS
  Administered 2020-10-12: 30 mg via INTRAVENOUS
  Administered 2020-10-12: 20 mg via INTRAVENOUS
  Administered 2020-10-12: 50 mg via INTRAVENOUS
  Administered 2020-10-12: 30 mg via INTRAVENOUS
  Administered 2020-10-12: 20 mg via INTRAVENOUS
  Administered 2020-10-12 (×3): 30 mg via INTRAVENOUS
  Administered 2020-10-12: 20 mg via INTRAVENOUS

## 2020-10-12 MED ORDER — LACTATED RINGERS IV SOLN: INTRAVENOUS | Status: DC

## 2020-10-12 MED ORDER — ACETAMINOPHEN 10 MG/ML IV SOLN: 1000.0000 mg | Freq: Once | INTRAVENOUS | Status: DC | PRN

## 2020-10-12 MED ORDER — LIDOCAINE HCL (CARDIAC) PF 100 MG/5ML IV SOSY
PREFILLED_SYRINGE | INTRAVENOUS | Status: DC | PRN
  Administered 2020-10-12: 30 mg via INTRAVENOUS

## 2020-10-12 SURGICAL SUPPLY — 38 items
BALLN DILATOR 10-12 8 (BALLOONS)
BALLN DILATOR 12-15 8 (BALLOONS)
BALLN DILATOR 15-18 8 (BALLOONS)
BALLN DILATOR CRE 0-12 8 (BALLOONS)
BALLN DILATOR ESOPH 8 10 CRE (MISCELLANEOUS) IMPLANT
BALLOON DILATOR 12-15 8 (BALLOONS) IMPLANT
BALLOON DILATOR 15-18 8 (BALLOONS) IMPLANT
BALLOON DILATOR CRE 0-12 8 (BALLOONS) IMPLANT
BLOCK BITE 60FR ADLT L/F GRN (MISCELLANEOUS) ×3 IMPLANT
CLIP HMST 235XBRD CATH ROT (MISCELLANEOUS) IMPLANT
CLIP RESOLUTION 360 11X235 (MISCELLANEOUS)
ELECT REM PT RETURN 9FT ADLT (ELECTROSURGICAL)
ELECTRODE REM PT RTRN 9FT ADLT (ELECTROSURGICAL) IMPLANT
FCP ESCP3.2XJMB 240X2.8X (MISCELLANEOUS)
FORCEPS BIOP RAD 4 LRG CAP 4 (CUTTING FORCEPS) ×3 IMPLANT
FORCEPS BIOP RJ4 240 W/NDL (MISCELLANEOUS)
FORCEPS ESCP3.2XJMB 240X2.8X (MISCELLANEOUS) IMPLANT
GOWN CVR UNV OPN BCK APRN NK (MISCELLANEOUS) ×4 IMPLANT
GOWN ISOL THUMB LOOP REG UNIV (MISCELLANEOUS) ×6
INJECTOR VARIJECT VIN23 (MISCELLANEOUS) IMPLANT
KIT DEFENDO VALVE AND CONN (KITS) IMPLANT
KIT PRC NS LF DISP ENDO (KITS) ×2 IMPLANT
KIT PROCEDURE OLYMPUS (KITS) ×3
MANIFOLD NEPTUNE II (INSTRUMENTS) ×3 IMPLANT
MARKER SPOT ENDO TATTOO 5ML (MISCELLANEOUS) IMPLANT
PROBE APC STR FIRE (PROBE) IMPLANT
RETRIEVER NET PLAT FOOD (MISCELLANEOUS) IMPLANT
RETRIEVER NET ROTH 2.5X230 LF (MISCELLANEOUS) ×3 IMPLANT
SNARE COLD EXACTO (MISCELLANEOUS) ×3 IMPLANT
SNARE SHORT THROW 13M SML OVAL (MISCELLANEOUS) IMPLANT
SNARE SHORT THROW 30M LRG OVAL (MISCELLANEOUS) IMPLANT
SNARE SNG USE RND 15MM (INSTRUMENTS) IMPLANT
SPOT EX ENDOSCOPIC TATTOO (MISCELLANEOUS)
SYR INFLATION 60ML (SYRINGE) IMPLANT
TRAP ETRAP POLY (MISCELLANEOUS) IMPLANT
VARIJECT INJECTOR VIN23 (MISCELLANEOUS)
WATER STERILE IRR 250ML POUR (IV SOLUTION) ×3 IMPLANT
WIRE CRE 18-20MM 8CM F G (MISCELLANEOUS) IMPLANT

## 2020-10-12 NOTE — Op Note (Signed)
[facility] Gastroenterology Patient Name: Bianca Suarez Procedure Date: 10/12/2020 10:12 AM MRN: [id number] Account #: [id number] Date of Birth: 1950/07/13 Admit Type: Outpatient Age: 70 Room: [facility] OR ROOM 01 Gender: Female Note Status: Finalized Procedure:             Upper GI endoscopy Indications:           Diarrhea Providers:             Lin Landsman MD, MD Referring MD:          Bethena Roys. Sowles, MD (Referring MD) Medicines:             General Anesthesia Complications:         No immediate complications. Estimated blood loss: None. Procedure:             Pre-Anesthesia Assessment:                        - Prior to the procedure, a History and Physical was                         performed, and patient medications and allergies were                         reviewed. The patient is competent. The risks and                         benefits of the procedure and the sedation options and                         risks were discussed with the patient. All questions                         were answered and informed consent was obtained.                         Patient identification and proposed procedure were                         verified by the physician, the nurse, the                         anesthesiologist, the anesthetist and the technician                         in the pre-procedure area in the procedure room in the                         endoscopy suite. Mental Status Examination: alert and                         oriented. Airway Examination: normal oropharyngeal                         airway and neck mobility. Respiratory Examination:                         clear to auscultation. CV Examination: normal.  Prophylactic Antibiotics: The patient does not require                         prophylactic antibiotics. Prior Anticoagulants: The                         patient has taken no previous anticoagulant or                          antiplatelet agents. ASA Grade Assessment: II - A                         patient with mild systemic disease. After reviewing                         the risks and benefits, the patient was deemed in                         satisfactory condition to undergo the procedure. The                         anesthesia plan was to use general anesthesia.                         Immediately prior to administration of medications,                         the patient was re-assessed for adequacy to receive                         sedatives. The heart rate, respiratory rate, oxygen                         saturations, blood pressure, adequacy of pulmonary                         ventilation, and response to care were monitored                         throughout the procedure. The physical status of the                         patient was re-assessed after the procedure.                        After obtaining informed consent, the endoscope was                         passed under direct vision. Throughout the procedure,                         the patient's blood pressure, pulse, and oxygen                         saturations were monitored continuously. The was                         introduced through the mouth, and advanced to the  second part of duodenum. The upper GI endoscopy was                         accomplished without difficulty. The patient tolerated                         the procedure well. Findings:      The duodenal bulb and second portion of the duodenum were normal.       Biopsies were taken with a cold forceps for histology.      Localized mucosal changes characterized by polypoid were found in the       second portion of the duodenum along the sweep. Biopsies were taken with       a cold forceps for histology.      The entire examined stomach was normal. Biopsies were taken with a cold       forceps for Helicobacter pylori testing.      The cardia  and gastric fundus were normal on retroflexion.      Esophagogastric landmarks were identified: the gastroesophageal junction       was found at 38 cm from the incisors.      A widely patent and non-obstructing Schatzki ring was found at the       gastroesophageal junction.      The examined esophagus was normal. Impression:            - Normal duodenal bulb and second portion of the                         duodenum. Biopsied.                        - Mucosal changes in the duodenum. Biopsied.                        - Normal stomach. Biopsied.                        - Esophagogastric landmarks identified.                        - Widely patent and non-obstructing Schatzki ring.                        - Normal esophagus. Recommendation:        - Await pathology results.                        - Proceed with colonoscopy as scheduled                        See colonoscopy report Procedure Code(s):     --- Professional ---                        [phone], Esophagogastroduodenoscopy, flexible,                         transoral; with biopsy, single or multiple Diagnosis Code(s):     --- Professional ---                        K31.89, Other diseases of  stomach and duodenum                        K22.2, Esophageal obstruction                        R19.7, Diarrhea, unspecified CPT copyright 2019 American Medical Association. All rights reserved. The codes documented in this report are preliminary and upon coder review may  be revised to meet current compliance requirements. Dr. Ulyess Mort Lin Landsman MD, MD 10/12/2020 10:34:55 AM This report has been signed electronically. Number of Addenda: 0 Note Initiated On: 10/12/2020 10:12 AM Estimated Blood Loss:  Estimated blood loss: none.      [facility]

## 2020-10-12 NOTE — Anesthesia Procedure Notes (Signed)
Procedure Name: MAC Date/Time: 10/12/2020 10:17 AM Performed by: Vanetta Shawl, CRNA Pre-anesthesia Checklist: Patient identified, Emergency Drugs available, Suction available, Timeout performed and Patient being monitored Patient Re-evaluated:Patient Re-evaluated prior to induction Oxygen Delivery Method: Nasal cannula Placement Confirmation: positive ETCO2

## 2020-10-12 NOTE — Transfer of Care (Signed)
Immediate Anesthesia Transfer of Care Note  Patient: Bianca Suarez  Procedure(s) Performed: COLONOSCOPY WITH PROPOFOL (N/A Rectum) ESOPHAGOGASTRODUODENOSCOPY (EGD) WITH PROPOFOL (N/A Esophagus) BIOPSY (N/A Esophagus) POLYPECTOMY (N/A Rectum)  Patient Location: PACU  Anesthesia Type: General  Level of Consciousness: awake, alert  and patient cooperative  Airway and Oxygen Therapy: Patient Spontanous Breathing and Patient connected to supplemental oxygen  Post-op Assessment: Post-op Vital signs reviewed, Patient's Cardiovascular Status Stable, Respiratory Function Stable, Patent Airway and No signs of Nausea or vomiting  Post-op Vital Signs: Reviewed and stable  Complications: No complications documented.

## 2020-10-12 NOTE — H&P (Signed)
Cephas Darby, MD [address]  [place]  [place], [place] [address]  Main: [phone]  Fax: [phone] Pager: [phone]  Primary Care Physician:  Steele Sizer, MD Primary Gastroenterologist:  Dr. Cephas Darby  Pre-Procedure History & Physical: HPI:  Bianca Suarez is a 70 y.o. female is here for an endoscopy and colonoscopy.   Past Medical History:  Diagnosis Date  . Fibrocystic disease of left breast   . Glaucoma   . Hyperlipidemia   . Hypertension   . Lactose intolerance in adult   . Left lateral epicondylitis   . TIA (transient ischemic attack)     Past Surgical History:  Procedure Laterality Date  . ABDOMINAL HYSTERECTOMY  05/20/2002  . COSMETIC SURGERY  2001   lens replacement  . EYE SURGERY    . FOOT SURGERY Bilateral 09/29/2017   Bone Spurs Cut Down    Prior to Admission medications   Medication Sig Start Date End Date Taking? Authorizing Provider  amlodipine-atorvastatin (CADUET) 10-20 MG tablet Take 1 tablet by mouth daily. 08/28/20  Yes Steele Sizer, MD  aspirin 81 MG tablet Take 1 tablet by mouth daily. 02/23/08  Yes [provider]  olmesartan-hydrochlorothiazide (BENICAR HCT) 40-25 MG tablet Take 1 tablet by mouth daily. 08/28/20  Yes Steele Sizer, MD    Allergies as of 10/09/2020  . (No Known Allergies)    Family History  Problem Relation Age of Onset  . Diabetes Mother   . Hypertension Mother   . Hypertension Father   . CVA Father   . Hypertension Sister   . Stroke Maternal Grandmother   . Heart disease Paternal Grandmother   . Diabetes Paternal Grandmother     Social History   Socioeconomic History  . Marital status: Married    Spouse name: Belenda Cruise  . Number of children: 3  . Years of education: Not on file  . Highest education level: Associate degree: academic program  Occupational History  . Not on file  Tobacco Use  . Smoking status: Never Smoker  . Smokeless tobacco: Never Used  Vaping Use   . Vaping Use: Never used  Substance and Sexual Activity  . Alcohol use: No    Alcohol/week: 0.0 standard drinks  . Drug use: No  . Sexual activity: Yes    Partners: Male    Birth control/protection: None    Comment: Hysterectomy-Total  Other Topics Concern  . 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 Difficulty of Paying Living Expenses: Not hard at all  Food Insecurity: No Food Insecurity  . Worried About Charity fundraiser in the Last Year: Never true  . Ran Out of Food in the Last Year: Never true  Transportation Needs: No Transportation Needs  . Lack of Transportation (Medical): No  . Lack of Transportation (Non-Medical): No  Physical Activity: Inactive  . Days of Exercise per Week: 0 days  . Minutes of Exercise per Session: 0 min  Stress: No Stress Concern Present  . Feeling of Stress : Not at all  Social Connections: Socially Integrated  . Frequency of Communication with Friends and Family: More than three times a week  . Frequency of Social Gatherings with Friends and Family: More than three times a week  . Attends  Religious Services: More than 4 times per year  . Active Member of Clubs or Organizations: Yes  . Attends Archivist Meetings: More than 4 times per year  . Marital Status: Married  Human resources officer Violence: Not At Risk  . Fear of Current or Ex-Partner: No  . Emotionally Abused: No  . Physically Abused: No  . Sexually Abused: No    Review of Systems: See HPI, otherwise negative ROS  Physical Exam: BP (!) 141/81   Pulse 65   Temp 97.8 F (36.6 C)   Ht 5\' 2"  (1.575 m)   Wt 64.4 kg   SpO2 99%   BMI 25.97 kg/m  General:   Alert,  pleasant and cooperative in NAD Head:  Normocephalic and atraumatic. Neck:  Supple; no masses or thyromegaly. Lungs:   Clear throughout to auscultation.    Heart:  Regular rate and rhythm. Abdomen:  Soft, nontender and nondistended. Normal bowel sounds, without guarding, and without rebound.   Neurologic:  Alert and  oriented x4;  grossly normal neurologically.  Impression/Plan: Bianca Suarez is here for an endoscopy and colonoscopy to be performed for chronic diarrhea  Risks, benefits, limitations, and alternatives regarding  endoscopy and colonoscopy have been reviewed with the patient.  Questions have been answered.  All parties agreeable.   Sherri Sear, MD  10/12/2020, 9:23 AM

## 2020-10-12 NOTE — Op Note (Signed)
[facility] Patient Name: Bianca Suarez Procedure Date: 10/12/2020 10:10 AM MRN: [id number] Account #: [id number] Date of Birth: January 26, 1951 Admit Type: Outpatient Age: 70 Room: [facility] OR ROOM 01 Gender: Female Note Status: Finalized Procedure:             Colonoscopy Indications:           Chronic diarrhea, Clinically significant diarrhea of                         unexplained origin Providers:             Lin Landsman MD, MD Referring MD:          Bethena Roys. Sowles, MD (Referring MD) Medicines:             General Anesthesia Complications:         No immediate complications. Estimated blood loss: None. Procedure:             Pre-Anesthesia Assessment:                        - Prior to the procedure, a History and Physical was                         performed, and patient medications and allergies were                         reviewed. The patient is competent. The risks and                         benefits of the procedure and the sedation options and                         risks were discussed with the patient. All questions                         were answered and informed consent was obtained.                         Patient identification and proposed procedure were                         verified by the physician, the nurse, the                         anesthesiologist, the anesthetist and the technician                         in the pre-procedure area in the procedure room in the                         endoscopy suite. Mental Status Examination: alert and                         oriented. Airway Examination: normal oropharyngeal                         airway and neck mobility. Respiratory Examination:  clear to auscultation. CV Examination: normal.                         Prophylactic Antibiotics: The patient does not require                         prophylactic antibiotics. Prior Anticoagulants: The                          patient has taken no previous anticoagulant or                         antiplatelet agents. ASA Grade Assessment: II - A                         patient with mild systemic disease. After reviewing                         the risks and benefits, the patient was deemed in                         satisfactory condition to undergo the procedure. The                         anesthesia plan was to use general anesthesia.                         Immediately prior to administration of medications,                         the patient was re-assessed for adequacy to receive                         sedatives. The heart rate, respiratory rate, oxygen                         saturations, blood pressure, adequacy of pulmonary                         ventilation, and response to care were monitored                         throughout the procedure. The physical status of the                         patient was re-assessed after the procedure.                        After obtaining informed consent, the colonoscope was                         passed under direct vision. Throughout the procedure,                         the patient's blood pressure, pulse, and oxygen                         saturations were monitored continuously. The was  introduced through the anus and advanced to the the                         terminal ileum, with identification of the appendiceal                         orifice and IC valve. The colonoscopy was performed                         with moderate difficulty due to significant looping                         and the patient's body habitus. Successful completion                         of the procedure was aided by applying abdominal                         pressure. The patient tolerated the procedure well.                         The quality of the bowel preparation was fair. Findings:      The perianal and digital rectal examinations  were normal. Pertinent       negatives include normal sphincter tone and no palpable rectal lesions.      The terminal ileum appeared normal.      Normal mucosa was found in the entire colon. Biopsies for histology were       taken with a cold forceps from the entire colon for evaluation of       microscopic colitis.      A 5 mm polyp was found in the descending colon. The polyp was sessile.       The polyp was removed with a cold snare. Resection and retrieval were       complete.      The retroflexed view of the distal rectum and anal verge was normal and       showed no anal or rectal abnormalities. Impression:            - Preparation of the colon was fair.                        - The examined portion of the ileum was normal.                        - Normal mucosa in the entire examined colon. Biopsied.                        - One 5 mm polyp in the descending colon, removed with                         a cold snare. Resected and retrieved.                        - The distal rectum and anal verge are normal on                         retroflexion view. Recommendation:        -  Discharge patient to home (with escort).                        - Resume previous diet today.                        - Continue present medications.                        - Await pathology results.                        - Repeat colonoscopy in 5 years for surveillance.                        - Return to my office as previously scheduled. Procedure Code(s):     --- Professional ---                        [phone], Colonoscopy, flexible; with removal of                         tumor(s), polyp(s), or other lesion(s) by snare                         technique                        [id number], 46, Colonoscopy, flexible; with biopsy, single                         or multiple Diagnosis Code(s):     --- Professional ---                        K63.5, Polyp of colon                        R19.7, Diarrhea, unspecified                         K52.9, Noninfective gastroenteritis and colitis,                         unspecified CPT copyright 2019 American Medical Association. All rights reserved. The codes documented in this report are preliminary and upon coder review may  be revised to meet current compliance requirements. Dr. Ulyess Mort Lin Landsman MD, MD 10/12/2020 10:56:59 AM This report has been signed electronically. Number of Addenda: 0 Note Initiated On: 10/12/2020 10:10 AM Scope Withdrawal Time: 0 hours 13 minutes 8 seconds  Total Procedure Duration: 0 hours 17 minutes 1 second  Estimated Blood Loss:  Estimated blood loss: none.      [facility]

## 2020-10-12 NOTE — Anesthesia Postprocedure Evaluation (Signed)
Anesthesia Post Note  Patient: Bianca Suarez  Procedure(s) Performed: COLONOSCOPY WITH PROPOFOL (N/A Rectum) ESOPHAGOGASTRODUODENOSCOPY (EGD) WITH PROPOFOL (N/A Esophagus) BIOPSY (N/A Esophagus) POLYPECTOMY (N/A Rectum)     Patient location during evaluation: PACU Anesthesia Type: General Level of consciousness: awake and alert Pain management: pain level controlled Vital Signs Assessment: post-procedure vital signs reviewed and stable Respiratory status: spontaneous breathing, nonlabored ventilation, respiratory function stable and patient connected to nasal cannula oxygen Cardiovascular status: blood pressure returned to baseline and stable Postop Assessment: no apparent nausea or vomiting Anesthetic complications: no   No complications documented.  Emmaleah Meroney A  Avaleigh Decuir

## 2020-10-12 NOTE — Anesthesia Preprocedure Evaluation (Addendum)
Anesthesia Evaluation  Patient identified by MRN, date of birth, ID band Patient awake    Reviewed: Allergy & Precautions, NPO status , Patient's Chart, lab work & pertinent test results, reviewed documented beta blocker date and time   History of Anesthesia Complications Negative for: history of anesthetic complications  Airway Mallampati: II  TM Distance: >3 FB Neck ROM: Limited    Dental  (+)    Pulmonary    breath sounds clear to auscultation       Cardiovascular hypertension, (-) angina(-) DOE  Rhythm:Regular Rate:Normal   HLD   Neuro/Psych TIA   GI/Hepatic neg GERD  ,  Endo/Other  diabetes (Pre-DM)  Renal/GU      Musculoskeletal  (+) Arthritis ,   Abdominal   Peds  Hematology   Anesthesia Other Findings   Reproductive/Obstetrics                            Anesthesia Physical Anesthesia Plan  ASA: II  Anesthesia Plan: General   Post-op Pain Management:    Induction: Intravenous  PONV Risk Score and Plan: 3 and Propofol infusion, TIVA and Treatment may vary due to age or medical condition  Airway Management Planned: Natural Airway and Nasal Cannula  Additional Equipment:   Intra-op Plan:   Post-operative Plan:   Informed Consent: I have reviewed the patients History and Physical, chart, labs and discussed the procedure including the risks, benefits and alternatives for the proposed anesthesia with the patient or authorized representative who has indicated his/her understanding and acceptance.       Plan Discussed with: CRNA and Anesthesiologist  Anesthesia Plan Comments:         Anesthesia Quick Evaluation

## 2020-10-13 ENCOUNTER — Encounter: Payer: Self-pay | Admitting: Gastroenterology

## 2020-10-14 LAB — PANCREATIC ELASTASE, FECAL: Pancreatic Elastase, Fecal: 306 ug Elast./g (ref >200)

## 2020-10-14 LAB — GI PROFILE, STOOL, PCR

## 2020-10-17 ENCOUNTER — Encounter: Payer: Self-pay | Admitting: Gastroenterology

## 2020-10-17 LAB — SURGICAL PATHOLOGY

## 2020-10-18 ENCOUNTER — Telehealth: Payer: Self-pay

## 2020-10-18 NOTE — Telephone Encounter (Signed)
-----   Message from Lin Landsman, MD sent at 10/17/2020  6:43 PM EDT ----- Stool studies are negative for infection and her pancreas function is also normal Please check with her if she is still having diarrhea  RV

## 2020-10-18 NOTE — Telephone Encounter (Signed)
Patient verbalized understanding of results. She states she is not having any diarrhea at this time. She will let us know if this changes

## 2021-01-09 ENCOUNTER — Ambulatory Visit: Payer: Medicare Other | Admitting: Gastroenterology

## 2021-01-09 ENCOUNTER — Encounter: Payer: Self-pay | Admitting: *Deleted

## 2021-02-26 ENCOUNTER — Other Ambulatory Visit: Payer: Self-pay | Admitting: Family Medicine

## 2021-02-26 DIAGNOSIS — E785 Hyperlipidemia, unspecified: Secondary | ICD-10-CM

## 2021-02-26 DIAGNOSIS — I10 Essential (primary) hypertension: Secondary | ICD-10-CM

## 2021-02-27 ENCOUNTER — Ambulatory Visit: Payer: Medicare Other | Admitting: Family Medicine

## 2021-08-21 ENCOUNTER — Other Ambulatory Visit: Payer: Self-pay | Admitting: Family Medicine

## 2021-08-21 DIAGNOSIS — E785 Hyperlipidemia, unspecified: Secondary | ICD-10-CM

## 2021-08-21 DIAGNOSIS — I10 Essential (primary) hypertension: Secondary | ICD-10-CM

## 2021-08-21 NOTE — Telephone Encounter (Signed)
Appt made for 11 am on 08-23-2021

## 2021-08-22 NOTE — Progress Notes (Signed)
Name: Bianca Suarez   MRN: [id number]    DOB: 1950-10-25   Date:08/23/2021 ? ?     Progress Note ? ?Subjective ? ?Chief Complaint ? ?Medication Refill ? ?HPI ? ?HTN and hyperlipidemia: taking medication as prescribed, she denies side effects. No chest pain or palpitation. BP is at goal.  ?  ?Pre-diabetes: A1C has been stable between 6.2% and 6.3 %  She denies polyphagia, polydipsia or polyuria. Discussed importance of life style modification. She is still drinking sodas, but now only one small can per day, trying to drink more water, but still uses sugar on her coffee. She is cutting down on carbs like potatoes, rice and pasta We will recheck labs today  ?  ?Hyperlipidemia: reviewed labs with patient LDL is at goal 71, HDL also at goal at 50. Normal triglycerides , she is on Caduet no side effects.Doing well She has a history of TIA but no recent problems We will repeat labs today  ?  ?OA: she states right knee no longer swelling but still has instability,  feels like knee will give out, no falls, she states not as bad as it used to be.  ? ?Senile purpura: she states intermittent bruises, doing better since only taking aspirin 81 mg about once a week instead of daily  ? ?She had a change in bowel movements but was evaluated by GI and negative studies, her bowel movements are back to normal  ? ?Patient Active Problem List  ? Diagnosis Date Noted  ? Chronic diarrhea   ? Pre-diabetes 07/22/2018  ? Hyperglycemia 01/21/2018  ? Dyslipidemia 12/24/2014  ? Fibrocystic breast disease 12/24/2014  ? Allergic to peanuts 12/24/2014  ? Glaucoma associated with ocular inflammation 12/24/2014  ? H/O transient cerebral ischemia 12/24/2014  ? Benign hypertension 12/24/2014  ? Lateral epicondylitis of left elbow 12/24/2014  ? ? ?Past Surgical History:  ?Procedure Laterality Date  ? ABDOMINAL HYSTERECTOMY  05/20/2002  ? BIOPSY N/A 10/12/2020  ? Procedure: BIOPSY;  Surgeon: Lin Landsman, MD;  Location: [place] [facility];   Service: Endoscopy;  Laterality: N/A;  ? COLONOSCOPY WITH PROPOFOL N/A 10/12/2020  ? Procedure: COLONOSCOPY WITH PROPOFOL;  Surgeon: Lin Landsman, MD;  Location: [facility];  Service: Endoscopy;  Laterality: N/A;  ? COSMETIC SURGERY  2001  ? lens replacement  ? ESOPHAGOGASTRODUODENOSCOPY (EGD) WITH PROPOFOL N/A 10/12/2020  ? Procedure: ESOPHAGOGASTRODUODENOSCOPY (EGD) WITH PROPOFOL;  Surgeon: Lin Landsman, MD;  Location: [facility];  Service: Endoscopy;  Laterality: N/A;  ? EYE SURGERY    ? FOOT SURGERY Bilateral 09/29/2017  ? Bone Spurs Cut Down  ? POLYPECTOMY N/A 10/12/2020  ? Procedure: POLYPECTOMY;  Surgeon: Lin Landsman, MD;  Location: [facility];  Service: Endoscopy;  Laterality: N/A;  ? ? ?Family History  ?Problem Relation Age of Onset  ? Diabetes Mother   ? Hypertension Mother   ? Hypertension Father   ? CVA Father   ? Hypertension Sister   ? Stroke Maternal Grandmother   ? Heart disease Paternal Grandmother   ? Diabetes Paternal Grandmother   ? ? ?Social History  ? ?Tobacco Use  ? Smoking status: Never  ? Smokeless tobacco: Never  ?Substance Use Topics  ? Alcohol use: No  ?  Alcohol/week: 0.0 standard drinks  ? ? ? ?Current Outpatient Medications:  ?  amlodipine-atorvastatin (CADUET) 10-20 MG tablet, Take 1 tablet by mouth once daily, Disp: 90 tablet, Rfl: 0 ?  aspirin 81 MG tablet, Take 1  tablet by mouth daily., Disp: , Rfl:  ?  olmesartan-hydrochlorothiazide (BENICAR HCT) 40-25 MG tablet, Take 1 tablet by mouth once daily, Disp: 90 tablet, Rfl: 0 ? ?No Known Allergies ? ?I personally reviewed active problem list, medication list, allergies, family history, social history, health maintenance with the patient/caregiver today. ? ? ?ROS ? ?Constitutional: Negative for fever or weight change.  ?Respiratory: Negative for cough and shortness of breath.   ?Cardiovascular: Negative for chest pain or palpitations.  ?Gastrointestinal: Negative for abdominal pain, no bowel  changes.  ?Musculoskeletal: Negative for gait problem or joint swelling.  ?Skin: Negative for rash.  ?Neurological: Negative for dizziness or headache.  ?No other specific complaints in a complete review of systems (except as listed in HPI above).  ? ?Objective ? ?Vitals:  ? 08/23/21 1032  ?BP: 112/72  ?Pulse: 62  ?Resp: 16  ?SpO2: 99%  ?Weight: 142 lb (64.4 kg)  ? ? ?Body mass index is 25.97 kg/m?. ? ?Physical Exam ? ?Constitutional: Patient appears well-developed and well-nourished.  No distress.  ?HEENT: head atraumatic, normocephalic, pupils equal and reactive to light,  neck supple ?Cardiovascular: Normal rate, regular rhythm and normal heart sounds.  No murmur heard. No BLE edema. ?Pulmonary/Chest: Effort normal and breath sounds normal. No respiratory distress. ?Abdominal: Soft.  There is no tenderness. ?Psychiatric: Patient has a normal mood and affect. behavior is normal. Judgment and thought content normal.  ? ?PHQ2/9: ? ?  08/23/2021  ? 10:18 AM 08/28/2020  ?  9:52 AM 08/22/2020  ? 10:21 AM 02/28/2020  ?  9:13 AM 08/19/2019  ? 10:24 AM  ?Depression screen PHQ 2/9  ?Decreased Interest 0 0 0 0 0  ?Down, Depressed, Hopeless 0 0 0 0 0  ?PHQ - 2 Score 0 0 0 0 0  ?  ?phq 9 is negative ? ? ?Fall Risk: ? ?  08/23/2021  ? 10:19 AM 08/28/2020  ?  9:52 AM 08/22/2020  ? 10:24 AM 02/28/2020  ?  9:13 AM 08/19/2019  ? 10:27 AM  ?Fall Risk   ?Falls in the past year? 0 0 0 0 0  ?Number falls in past yr: 0 0 0 0 0  ?Injury with Fall? 0 0 0 0 0  ?Risk for fall due to : No Fall Risks  No Fall Risks  No Fall Risks  ?Follow up Falls prevention discussed  Falls prevention discussed  Falls prevention discussed  ? ? ? ?Assessment & Plan ? ?1. Stage 3a chronic kidney disease ([place]) ? ?- CBC with Differential/Platelet ?- COMPLETE METABOLIC PANEL WITH GFR ? ?2. Dyslipidemia ? ?- Lipid panel ?- amlodipine-atorvastatin (CADUET) 10-20 MG tablet; Take 1 tablet by mouth daily.  Dispense: 90 tablet; Refill: 1 ? ?3. Pre-diabetes ? ? ?4. Senile purpura  ([place]) ? ? ?5. Hypertension, benign ? ?- olmesartan-hydrochlorothiazide (BENICAR HCT) 40-25 MG tablet; Take 1 tablet by mouth daily.  Dispense: 90 tablet; Refill: 1 ?- amlodipine-atorvastatin (CADUET) 10-20 MG tablet; Take 1 tablet by mouth daily.  Dispense: 90 tablet; Refill: 1 ? ?6. Hyperglycemia ? ?- Hemoglobin A1c ? ?7. Need for Tdap vaccination ? ?- Tdap (ADACEL) 09-18-13.5 LF-MCG/0.5 injection; Inject 0.5 mLs into the muscle once for 1 dose.  Dispense: 0.5 mL; Refill: 0   ?

## 2021-08-23 ENCOUNTER — Encounter: Payer: Self-pay | Admitting: Family Medicine

## 2021-08-23 ENCOUNTER — Ambulatory Visit (INDEPENDENT_AMBULATORY_CARE_PROVIDER_SITE_OTHER): Payer: Medicare Other | Admitting: Family Medicine

## 2021-08-23 ENCOUNTER — Ambulatory Visit (INDEPENDENT_AMBULATORY_CARE_PROVIDER_SITE_OTHER): Payer: Medicare Other

## 2021-08-23 VITALS — BP 112/72 | HR 62 | Temp 98.3°F | Resp 16 | Ht 62.0 in | Wt 142.9 lb

## 2021-08-23 VITALS — BP 112/72 | HR 62 | Resp 16 | Wt 142.0 lb

## 2021-08-23 DIAGNOSIS — N1831 Chronic kidney disease, stage 3a: Secondary | ICD-10-CM

## 2021-08-23 DIAGNOSIS — Z1231 Encounter for screening mammogram for malignant neoplasm of breast: Secondary | ICD-10-CM

## 2021-08-23 DIAGNOSIS — Z780 Asymptomatic menopausal state: Secondary | ICD-10-CM

## 2021-08-23 DIAGNOSIS — E785 Hyperlipidemia, unspecified: Secondary | ICD-10-CM | POA: Diagnosis not present

## 2021-08-23 DIAGNOSIS — I10 Essential (primary) hypertension: Secondary | ICD-10-CM

## 2021-08-23 DIAGNOSIS — R7303 Prediabetes: Secondary | ICD-10-CM

## 2021-08-23 DIAGNOSIS — D692 Other nonthrombocytopenic purpura: Secondary | ICD-10-CM

## 2021-08-23 DIAGNOSIS — Z0000 Encounter for general adult medical examination without abnormal findings: Secondary | ICD-10-CM | POA: Diagnosis not present

## 2021-08-23 DIAGNOSIS — Z13820 Encounter for screening for osteoporosis: Secondary | ICD-10-CM

## 2021-08-23 DIAGNOSIS — Z23 Encounter for immunization: Secondary | ICD-10-CM

## 2021-08-23 DIAGNOSIS — R739 Hyperglycemia, unspecified: Secondary | ICD-10-CM | POA: Diagnosis not present

## 2021-08-23 MED ORDER — AMLODIPINE-ATORVASTATIN 10-20 MG PO TABS: 1.0000 | ORAL_TABLET | Freq: Every day | ORAL | 1 refills | Status: DC

## 2021-08-23 MED ORDER — TETANUS-DIPHTH-ACELL PERTUSSIS 5-2-15.5 LF-MCG/0.5 IM SUSP: 0.5000 mL | Freq: Once | INTRAMUSCULAR | 0 refills | Status: AC

## 2021-08-23 MED ORDER — OLMESARTAN MEDOXOMIL-HCTZ 40-25 MG PO TABS: 1.0000 | ORAL_TABLET | Freq: Every day | ORAL | 1 refills | Status: DC

## 2021-08-23 NOTE — Patient Instructions (Signed)
Ms. Patin , ?Thank you for taking time to come for your Medicare Wellness Visit. I appreciate your ongoing commitment to your health goals. Please review the following plan we discussed and let me know if I can assist you in the future.  ? ?Screening recommendations/referrals: ?Colonoscopy: done 10/12/20. Repeat 09/2025 ?Mammogram: done 04/07/15. Please call [phone] to schedule your mammogram and bone density screening.  ?Bone Density: due ?Recommended yearly ophthalmology/optometry visit for glaucoma screening and checkup ?Recommended yearly dental visit for hygiene and checkup ? ?Vaccinations: ?Influenza vaccine: declined ?Pneumococcal vaccine: done 02/09/19 ?Tdap vaccine: due ?Shingles vaccine: done 10/07/16 & 06/20/17   ?Covid-19:done 08/26/19 & 06/14/20 ? ?Advanced directives: Advance directive discussed with you today. I have provided a copy for you to complete at home and have notarized. Once this is complete please bring a copy in to our office so we can scan it into your chart.  ? ?Conditions/risks identified: Keep up the great work! ? ?Next appointment: Follow up in one year for your annual wellness visit  ? ? ?Preventive Care 18 Years and Older, Female ?Preventive care refers to lifestyle choices and visits with your health care provider that can promote health and wellness. ?What does preventive care include? ?A yearly physical exam. This is also called an annual well check. ?Dental exams once or twice a year. ?Routine eye exams. Ask your health care provider how often you should have your eyes checked. ?Personal lifestyle choices, including: ?Daily care of your teeth and gums. ?Regular physical activity. ?Eating a healthy diet. ?Avoiding tobacco and drug use. ?Limiting alcohol use. ?Practicing safe sex. ?Taking low-dose aspirin every day. ?Taking vitamin and mineral supplements as recommended by your health care provider. ?What happens during an annual well check? ?The services and screenings done by  your health care provider during your annual well check will depend on your age, overall health, lifestyle risk factors, and family history of disease. ?Counseling  ?Your health care provider may ask you questions about your: ?Alcohol use. ?Tobacco use. ?Drug use. ?Emotional well-being. ?Home and relationship well-being. ?Sexual activity. ?Eating habits. ?History of falls. ?Memory and ability to understand (cognition). ?Work and work Statistician. ?Reproductive health. ?Screening  ?You may have the following tests or measurements: ?Height, weight, and BMI. ?Blood pressure. ?Lipid and cholesterol levels. These may be checked every 5 years, or more frequently if you are over 11 years old. ?Skin check. ?Lung cancer screening. You may have this screening every year starting at age 66 if you have a 30-pack-year history of smoking and currently smoke or have quit within the past 15 years. ?Fecal occult blood test (FOBT) of the stool. You may have this test every year starting at age 41. ?Flexible sigmoidoscopy or colonoscopy. You may have a sigmoidoscopy every 5 years or a colonoscopy every 10 years starting at age 52. ?Hepatitis C blood test. ?Hepatitis B blood test. ?Sexually transmitted disease (STD) testing. ?Diabetes screening. This is done by checking your blood sugar (glucose) after you have not eaten for a while (fasting). You may have this done every 1-3 years. ?Bone density scan. This is done to screen for osteoporosis. You may have this done starting at age 57. ?Mammogram. This may be done every 1-2 years. Talk to your health care provider about how often you should have regular mammograms. ?Talk with your health care provider about your test results, treatment options, and if necessary, the need for more tests. ?Vaccines  ?Your health care provider may recommend certain vaccines, such as: ?Influenza  vaccine. This is recommended every year. ?Tetanus, diphtheria, and acellular pertussis (Tdap, Td) vaccine. You  may need a Td booster every 10 years. ?Zoster vaccine. You may need this after age 27. ?Pneumococcal 13-valent conjugate (PCV13) vaccine. One dose is recommended after age 59. ?Pneumococcal polysaccharide (PPSV23) vaccine. One dose is recommended after age 14. ?Talk to your health care provider about which screenings and vaccines you need and how often you need them. ?This information is not intended to replace advice given to you by your health care provider. Make sure you discuss any questions you have with your health care provider. ?Document Released: 06/02/2015 Document Revised: 01/24/2016 Document Reviewed: 03/07/2015 ?Elsevier Interactive Patient Education ? 2017 [employer]. ? ?Fall Prevention in the Home ?Falls can cause injuries. They can happen to people of all ages. There are many things you can do to make your home safe and to help prevent falls. ?What can I do on the outside of my home? ?Regularly fix the edges of walkways and driveways and fix any cracks. ?Remove anything that might make you trip as you walk through a door, such as a raised step or threshold. ?Trim any bushes or trees on the path to your home. ?Use bright outdoor lighting. ?Clear any walking paths of anything that might make someone trip, such as rocks or tools. ?Regularly check to see if handrails are loose or broken. Make sure that both sides of any steps have handrails. ?Any raised decks and porches should have guardrails on the edges. ?Have any leaves, snow, or ice cleared regularly. ?Use sand or salt on walking paths during winter. ?Clean up any spills in your garage right away. This includes oil or grease spills. ?What can I do in the bathroom? ?Use night lights. ?Install grab bars by the toilet and in the tub and shower. Do not use towel bars as grab bars. ?Use non-skid mats or decals in the tub or shower. ?If you need to sit down in the shower, use a plastic, non-slip stool. ?Keep the floor dry. Clean up any water that spills  on the floor as soon as it happens. ?Remove soap buildup in the tub or shower regularly. ?Attach bath mats securely with double-sided non-slip rug tape. ?Do not have throw rugs and other things on the floor that can make you trip. ?What can I do in the bedroom? ?Use night lights. ?Make sure that you have a light by your bed that is easy to reach. ?Do not use any sheets or blankets that are too big for your bed. They should not hang down onto the floor. ?Have a firm chair that has side arms. You can use this for support while you get dressed. ?Do not have throw rugs and other things on the floor that can make you trip. ?What can I do in the kitchen? ?Clean up any spills right away. ?Avoid walking on wet floors. ?Keep items that you use a lot in easy-to-reach places. ?If you need to reach something above you, use a strong step stool that has a grab bar. ?Keep electrical cords out of the way. ?Do not use floor polish or wax that makes floors slippery. If you must use wax, use non-skid floor wax. ?Do not have throw rugs and other things on the floor that can make you trip. ?What can I do with my stairs? ?Do not leave any items on the stairs. ?Make sure that there are handrails on both sides of the stairs and use them. Fix  handrails that are broken or loose. Make sure that handrails are as long as the stairways. ?Check any carpeting to make sure that it is firmly attached to the stairs. Fix any carpet that is loose or worn. ?Avoid having throw rugs at the top or bottom of the stairs. If you do have throw rugs, attach them to the floor with carpet tape. ?Make sure that you have a light switch at the top of the stairs and the bottom of the stairs. If you do not have them, ask someone to add them for you. ?What else can I do to help prevent falls? ?Wear shoes that: ?Do not have high heels. ?Have rubber bottoms. ?Are comfortable and fit you well. ?Are closed at the toe. Do not wear sandals. ?If you use a stepladder: ?Make  sure that it is fully opened. Do not climb a closed stepladder. ?Make sure that both sides of the stepladder are locked into place. ?Ask someone to hold it for you, if possible. ?Clearly mark and make sure

## 2021-08-23 NOTE — Progress Notes (Signed)
? ?Subjective:  ? Bianca Suarez is a 71 y.o. female who presents for Medicare Annual (Subsequent) preventive examination. ? ?Review of Systems    ? ?Cardiac Risk Factors include: advanced age (>7mn, >>73women);hypertension;dyslipidemia ? ?   ?Objective:  ?  ?Today's Vitals  ? 08/23/21 1013  ?BP: 112/72  ?Pulse: 62  ?Resp: 16  ?Temp: 98.3 ?F (36.8 ?C)  ?TempSrc: Oral  ?SpO2: 99%  ?Weight: 142 lb 14.4 oz (64.8 kg)  ?Height: '5\' 2"'$  (1.575 m)  ? ?Body mass index is 26.14 kg/m?. ? ? ?  08/23/2021  ? 10:19 AM 10/12/2020  ?  9:02 AM 08/22/2020  ? 10:22 AM 08/19/2019  ? 10:26 AM 10/07/2016  ? 11:30 AM 06/11/2016  ? 11:33 AM 04/02/2016  ?  9:27 AM  ?Advanced Directives  ?Does Patient Have a Medical Advance Directive? No No No No No No No  ?Would patient like information on creating a medical advance directive? Yes (MAU/Ambulatory/Procedural Areas - Information given) No - Patient declined No - Patient declined Yes (MAU/Ambulatory/Procedural Areas - Information given)   No - patient declined information  ? ? ?Current Medications (verified) ?Outpatient Encounter Medications as of 08/23/2021  ?Medication Sig  ? amlodipine-atorvastatin (CADUET) 10-20 MG tablet Take 1 tablet by mouth once daily  ? aspirin 81 MG tablet Take 1 tablet by mouth daily.  ? olmesartan-hydrochlorothiazide (BENICAR HCT) 40-25 MG tablet Take 1 tablet by mouth once daily  ? ?No facility-administered encounter medications on file as of 08/23/2021.  ? ? ?Allergies (verified) ?Patient has no known allergies.  ? ?History: ?Past Medical History:  ?Diagnosis Date  ? Fibrocystic disease of left breast   ? Glaucoma   ? Hyperlipidemia   ? Hypertension   ? Lactose intolerance in adult   ? Left lateral epicondylitis   ? TIA (transient ischemic attack)   ? ?Past Surgical History:  ?Procedure Laterality Date  ? ABDOMINAL HYSTERECTOMY  05/20/2002  ? BIOPSY N/A 10/12/2020  ? Procedure: BIOPSY;  Surgeon: VLin Landsman MD;  [place] [facility]  Service: Endoscopy;   Laterality: N/A;  ? COLONOSCOPY WITH PROPOFOL N/A 10/12/2020  ? Procedure: COLONOSCOPY WITH PROPOFOL;  Surgeon: VLin Landsman MD;  [place] [facility]  Service: Endoscopy;  Laterality: N/A;  ? COSMETIC SURGERY  2001  ? lens replacement  ? ESOPHAGOGASTRODUODENOSCOPY (EGD) WITH PROPOFOL N/A 10/12/2020  ? Procedure: ESOPHAGOGASTRODUODENOSCOPY (EGD) WITH PROPOFOL;  Surgeon: VLin Landsman MD;  [place] [facility]  Service: Endoscopy;  Laterality: N/A;  ? EYE SURGERY    ? FOOT SURGERY Bilateral 09/29/2017  ? Bone Spurs Cut Down  ? POLYPECTOMY N/A 10/12/2020  ? Procedure: POLYPECTOMY;  Surgeon: VLin Landsman MD;  [place] [facility]  Service: Endoscopy;  Laterality: N/A;  ? ?Family History  ?Problem Relation Age of Onset  ? Diabetes Mother   ? Hypertension Mother   ? Hypertension Father   ? CVA Father   ? Hypertension Sister   ? Stroke Maternal Grandmother   ? Heart disease Paternal Grandmother   ? Diabetes Paternal Grandmother   ? ?Social History  ? ?Socioeconomic History  ? Marital status: Married  ?  Spouse name: GBelenda Cruise ? Number of children: 3  ? Years of education: Not on file  ? Highest education level: Associate degree: academic program  ?Occupational History  ? Not on file  ?Tobacco Use  ? Smoking status: Never  ? Smokeless tobacco: Never  ?Vaping Use  ? Vaping Use: Never  used  ?Substance and Sexual Activity  ? Alcohol use: No  ?  Alcohol/week: 0.0 standard drinks  ? Drug use: No  ? Sexual activity: Yes  ?  Partners: Male  ?  Birth control/protection: None  ?  Comment: Hysterectomy-Total  ?Other Topics Concern  ? Not on file  ?Social History Narrative  ? 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 Also likes to decorate for events   ? ?Social Determinants of Health  ? ?Financial Resource Strain: Low Risk   ? Difficulty of Paying Living Expenses: Not hard at all  ?Food Insecurity: No Food Insecurity  ? Worried About Sales executive in the Last Year: Never true  ? Ran Out of Food in the Last Year: Never true  ?Transportation Needs: No Transportation Needs  ? Lack of Transportation (Medical): No  ? Lack of Transportation (Non-Medical): No  ?Physical Activity: Inactive  ? Days of Exercise per Week: 0 days  ? Minutes of Exercise per Session: 0 min  ?Stress: No Stress Concern Present  ? Feeling of Stress : Not at all  ?Social Connections: Socially Integrated  ? Frequency of Communication with Friends and Family: More than three times a week  ? Frequency of Social Gatherings with Friends and Family: More than three times a week  ? Attends Religious Services: More than 4 times per year  ? Active Member of Clubs or Organizations: Yes  ? Attends Archivist Meetings: More than 4 times per year  ? Marital Status: Married  ? ? ?Tobacco Counseling ?Counseling given: Not Answered ? ? ?Clinical Intake: ? ?Pre-visit preparation completed: Yes ? ?Pain : No/denies pain ? ?  ? ?BMI - recorded: 26.14 ?Nutritional Status: BMI 25 -29 Overweight ?Nutritional Risks: None ?Diabetes: No ? ?How often do you need to have someone help you when you read instructions, pamphlets, or other written materials from your doctor or pharmacy?: 1 - Never ? ? ? ?Interpreter Needed?: No ? ?Information entered by :: Clemetine Marker LPN ? ? ?Activities of Daily Living ? ?  08/23/2021  ? 10:19 AM 10/12/2020  ?  8:53 AM  ?In your present state of health, do you have any difficulty performing the following activities:  ?Hearing? 0 0  ?Vision? 0 0  ?Difficulty concentrating or making decisions? 0 0  ?Walking or climbing stairs? 0 0  ?Dressing or bathing? 0 0  ?Doing errands, shopping? 0   ?Preparing Food and eating ? N   ?Using the Toilet? N   ?In the past six months, have you accidently leaked urine? N   ?Do you have problems with loss of bowel control? N   ?Managing your Medications? N   ?Managing your Finances? N   ?Housekeeping or managing your Housekeeping? N   ? ? ?Patient  Care Team: ?Steele Sizer, MD as PCP - General (Family Medicine) ?Edrick Kins, DPM as Consulting Physician (Podiatry) ?Thelma Comp, OD (Ophthalmology) ? ?Indicate any recent Medical Services you may have received from other than [facility] providers in the past year (date may be approximate). ? ?   ?Assessment:  ? This is a routine wellness examination for [facility]. ? ?Hearing/Vision screen ?Hearing Screening - Comments:: Pt denies hearing difficulty ?Vision Screening - Comments:: [facility] vision screenings done at [facility] ? ?Dietary issues and exercise activities discussed: ?Current Exercise Habits: The patient does not participate in regular exercise at present, Exercise limited by: None identified ? ?  Goals Addressed   ? ?  ?  ?  ?  ? This Visit's Progress  ?  Patient Stated   On track  ?  Patient states she would like to maintain overall health and blood pressure within normal limits.  ?  ? ?  ? ?Depression Screen ? ?  08/23/2021  ? 10:18 AM 08/28/2020  ?  9:52 AM 08/22/2020  ? 10:21 AM 02/28/2020  ?  9:13 AM 08/19/2019  ? 10:24 AM 08/09/2019  ? 10:34 AM 02/02/2019  ?  8:27 AM  ?PHQ 2/9 Scores  ?PHQ - 2 Score 0 0 0 0 0 0 0  ?PHQ- 9 Score      0 0  ?  ?Fall Risk ? ?  08/23/2021  ? 10:19 AM 08/28/2020  ?  9:52 AM 08/22/2020  ? 10:24 AM 02/28/2020  ?  9:13 AM 08/19/2019  ? 10:27 AM  ?Fall Risk   ?Falls in the past year? 0 0 0 0 0  ?Number falls in past yr: 0 0 0 0 0  ?Injury with Fall? 0 0 0 0 0  ?Risk for fall due to : No Fall Risks  No Fall Risks  No Fall Risks  ?Follow up Falls prevention discussed  Falls prevention discussed  Falls prevention discussed  ? ? ?FALL RISK PREVENTION PERTAINING TO THE HOME: ? ?Any stairs in or around the home? Yes  ?If so, are there any without handrails? No  ?Home free of loose throw rugs in walkways, pet beds, electrical cords, etc? Yes  ?Adequate lighting in your home to reduce risk of falls? Yes  ? ?ASSISTIVE DEVICES UTILIZED TO PREVENT FALLS: ? ?Life alert? No  ?Use of a cane,  walker or w/c? No  ?Grab bars in the bathroom? No  ?Shower chair or bench in shower? No  ?Elevated toilet seat or a handicapped toilet? No  ? ?TIMED UP AND GO: ? ?Was the test performed? Yes .  ?Length of

## 2021-08-24 LAB — COMPLETE METABOLIC PANEL WITH GFR
AG Ratio: 1.3 (calc) (ref 1.0–2.5)
ALT: 23 U/L (ref 6–29)
AST: 23 U/L (ref 10–35)
Albumin: 4.5 g/dL (ref 3.6–5.1)
Alkaline phosphatase (APISO): 105 U/L (ref 37–153)
BUN/Creatinine Ratio: 24 (calc) — ABNORMAL HIGH (ref 6–22)
BUN: 27 mg/dL — ABNORMAL HIGH (ref 7–25)
CO2: 27 mmol/L (ref 20–32)
Calcium: 10 mg/dL (ref 8.6–10.4)
Chloride: 104 mmol/L (ref 98–110)
Creat: 1.11 mg/dL — ABNORMAL HIGH (ref 0.50–1.05)
Globulin: 3.5 g/dL (calc) (ref 1.9–3.7)
Glucose, Bld: 99 mg/dL (ref 65–99)
Potassium: 4.2 mmol/L (ref 3.5–5.3)
Sodium: 138 mmol/L (ref 135–146)
Total Bilirubin: 1.1 mg/dL (ref 0.2–1.2)
Total Protein: 8 g/dL (ref 6.1–8.1)
eGFR: 54 mL/min/{1.73_m2} — ABNORMAL LOW (ref >=60)

## 2021-08-24 LAB — LIPID PANEL
Cholesterol: 159 mg/dL (ref <200)
HDL: 55 mg/dL (ref >=50)
LDL Cholesterol (Calc): 85 mg/dL (calc)
Non-HDL Cholesterol (Calc): 104 mg/dL (calc) (ref <130)
Total CHOL/HDL Ratio: 2.9 (calc) (ref <5.0)
Triglycerides: 94 mg/dL (ref <150)

## 2021-08-24 LAB — CBC WITH DIFFERENTIAL/PLATELET
Absolute Monocytes: 538 cells/uL (ref 200–950)
Basophils Absolute: 90 cells/uL (ref 0–200)
Basophils Relative: 1.4 %
Eosinophils Absolute: 102 cells/uL (ref 15–500)
Eosinophils Relative: 1.6 %
HCT: 38.4 % (ref 35.0–45.0)
Hemoglobin: 12.7 g/dL (ref 11.7–15.5)
Lymphs Abs: 1946 cells/uL (ref 850–3900)
MCH: 29.5 pg (ref 27.0–33.0)
MCHC: 33.1 g/dL (ref 32.0–36.0)
MCV: 89.1 fL (ref 80.0–100.0)
MPV: 9.2 fL (ref 7.5–12.5)
Monocytes Relative: 8.4 %
Neutro Abs: 3725 cells/uL (ref 1500–7800)
Neutrophils Relative %: 58.2 %
Platelets: 377 10*3/uL (ref 140–400)
RBC: 4.31 10*6/uL (ref 3.80–5.10)
RDW: 12.6 % (ref 11.0–15.0)
Total Lymphocyte: 30.4 %
WBC: 6.4 10*3/uL (ref 3.8–10.8)

## 2021-08-24 LAB — HEMOGLOBIN A1C
Hgb A1c MFr Bld: 6.2 % of total Hgb — ABNORMAL HIGH (ref <5.7)
Mean Plasma Glucose: 131 mg/dL
eAG (mmol/L): 7.3 mmol/L

## 2022-02-22 ENCOUNTER — Ambulatory Visit: Payer: Medicare Other | Admitting: Family Medicine

## 2022-03-11 NOTE — Progress Notes (Signed)
Name: Bianca Suarez   MRN: [id number]    DOB: 09/15/1950   Date:03/12/2022       Progress Note  Subjective  Chief Complaint  Follow Up  HPI  HTN and hyperlipidemia: taking medication as prescribed, she denies side effects. No chest pain or palpitation. BP is at goal.   CKI: she is taking ARB, we will continue to monitor, avoid NSAID's continue drinking water . Recheck next visit    Pre-diabetes: A1C has been stable between 6.2% and 6.3 %. Last visit it was 6.2 %  She denies polyphagia, polydipsia or polyuria. Discussed importance of life style modification. She is still drinking sodas, but now only one small can per day, trying to drink more water, she has been following a low carbohydrate diet   Hyperlipidemia: reviewed labs with patient last LDL was up from 71 to 85. She is taking Caduet no side effects. She has a history of TIA but no recent problems  Explained goal LDL is below 70 , we will adjust dose of Caduet to 10/40 and recheck labs next visit    OA: she states right knee no longer swelling , she states instability not as noticeable since she downsized to one level plan.   Senile purpura: she states intermittent bruises, doing better since only taking aspirin 81 mg about once a week instead of daily . Unchanged   Patient Active Problem List   Diagnosis Date Noted   Senile purpura ([place]) 08/23/2021   Stage 3a chronic kidney disease ([place]) 08/23/2021   Chronic diarrhea    Pre-diabetes 07/22/2018   Hyperglycemia 01/21/2018   Dyslipidemia 12/24/2014   Fibrocystic breast disease 12/24/2014   Allergic to peanuts 12/24/2014   Glaucoma associated with ocular inflammation 12/24/2014   H/O transient cerebral ischemia 12/24/2014   Benign hypertension 12/24/2014   Lateral epicondylitis of left elbow 12/24/2014    Past Surgical History:  Procedure Laterality Date   ABDOMINAL HYSTERECTOMY  05/20/2002   BIOPSY N/A 10/12/2020   Procedure: BIOPSY;  Surgeon: Lin Landsman, MD;   Location: [place];  Service: Endoscopy;  Laterality: N/A;   COLONOSCOPY WITH PROPOFOL N/A 10/12/2020   Procedure: COLONOSCOPY WITH PROPOFOL;  Surgeon: Lin Landsman, MD;  Location: [place];  Service: Endoscopy;  Laterality: N/A;   COSMETIC SURGERY  2001   lens replacement   ESOPHAGOGASTRODUODENOSCOPY (EGD) WITH PROPOFOL N/A 10/12/2020   Procedure: ESOPHAGOGASTRODUODENOSCOPY (EGD) WITH PROPOFOL;  Surgeon: Lin Landsman, MD;  Location: [place];  Service: Endoscopy;  Laterality: N/A;   EYE SURGERY     FOOT SURGERY Bilateral 09/29/2017   Bone Spurs Cut Down   POLYPECTOMY N/A 10/12/2020   Procedure: POLYPECTOMY;  Surgeon: Lin Landsman, MD;  Location: [place];  Service: Endoscopy;  Laterality: N/A;    Family History  Problem Relation Age of Onset   Diabetes Mother    Hypertension Mother    Hypertension Father    CVA Father    Hypertension Sister    Stroke Maternal Grandmother    Heart disease Paternal Grandmother    Diabetes Paternal Grandmother     Social History   Tobacco Use   Smoking status: Never   Smokeless tobacco: Never  Substance Use Topics   Alcohol use: No    Alcohol/week: 0.0 standard drinks of alcohol     Current Outpatient Medications:    amlodipine-atorvastatin (CADUET) 10-20 MG tablet, Take 1 tablet by mouth daily., Disp: 90 tablet, Rfl: 1   aspirin  81 MG tablet, Take 1 tablet by mouth daily., Disp: , Rfl:    olmesartan-hydrochlorothiazide (BENICAR HCT) 40-25 MG tablet, Take 1 tablet by mouth daily., Disp: 90 tablet, Rfl: 1  No Known Allergies  I personally reviewed active problem list, medication list, allergies, family history, social history, health maintenance with the patient/caregiver today.   ROS  Constitutional: Negative for fever or weight change.  Respiratory: Negative for cough and shortness of breath.   Cardiovascular: Negative for chest pain or palpitations.  Gastrointestinal:  Negative for abdominal pain, no bowel changes.  Musculoskeletal: Negative for gait problem or joint swelling.  Skin: Negative for rash.  Neurological: Negative for dizziness or headache.  No other specific complaints in a complete review of systems (except as listed in HPI above).   Objective  Vitals:   03/12/22 0902  BP: 122/82  Pulse: 85  Resp: 16  SpO2: 98%  Weight: 144 lb (65.3 kg)  Height: '5\' 3"'$  (1.6 m)    Body mass index is 25.51 kg/m.  Physical Exam  Constitutional: Patient appears well-developed and well-nourished.  No distress.  HEENT: head atraumatic, normocephalic, pupils equal and reactive to light, neck supple Cardiovascular: Normal rate, regular rhythm and normal heart sounds.  No murmur heard. No BLE edema. Pulmonary/Chest: Effort normal and breath sounds normal. No respiratory distress. Abdominal: Soft.  There is no tenderness. Psychiatric: Patient has a normal mood and affect. behavior is normal. Judgment and thought content normal.   PHQ2/9:    03/12/2022    9:02 AM 08/23/2021   10:18 AM 08/28/2020    9:52 AM 08/22/2020   10:21 AM 02/28/2020    9:13 AM  Depression screen PHQ 2/9  Decreased Interest 0 0 0 0 0  Down, Depressed, Hopeless 0 0 0 0 0  PHQ - 2 Score 0 0 0 0 0  Altered sleeping 0      Tired, decreased energy 0      Change in appetite 0      Feeling bad or failure about yourself  0      Trouble concentrating 0      Moving slowly or fidgety/restless 0      Suicidal thoughts 0      PHQ-9 Score 0        phq 9 is negative   Fall Risk:    03/12/2022    9:02 AM 08/23/2021   10:19 AM 08/28/2020    9:52 AM 08/22/2020   10:24 AM 02/28/2020    9:13 AM  Fall Risk   Falls in the past year? 0 0 0 0 0  Number falls in past yr: 0 0 0 0 0  Injury with Fall? 0 0 0 0 0  Risk for fall due to : No Fall Risks No Fall Risks  No Fall Risks   Follow up Falls prevention discussed Falls prevention discussed  Falls prevention discussed       Functional  Status Survey: Is the patient deaf or have difficulty hearing?: No Does the patient have difficulty seeing, even when wearing glasses/contacts?: No Does the patient have difficulty concentrating, remembering, or making decisions?: No Does the patient have difficulty walking or climbing stairs?: No Does the patient have difficulty dressing or bathing?: No Does the patient have difficulty doing errands alone such as visiting a doctor's office or shopping?: No    Assessment & Plan  1. Stage 3a chronic kidney disease (HCC)  - olmesartan-hydrochlorothiazide (BENICAR HCT) 40-25 MG tablet; Take 1 tablet by  mouth daily.  Dispense: 90 tablet; Refill: 1  2. Senile purpura ([place])  Reassurance given  3. Hypertension, benign  - olmesartan-hydrochlorothiazide (BENICAR HCT) 40-25 MG tablet; Take 1 tablet by mouth daily.  Dispense: 90 tablet; Refill: 1 - amLODipine-atorvastatin (CADUET) 10-40 MG tablet; Take 1 tablet by mouth daily.  Dispense: 90 tablet; Refill: 1  4. Dyslipidemia  - amLODipine-atorvastatin (CADUET) 10-40 MG tablet; Take 1 tablet by mouth daily.  Dispense: 90 tablet; Refill: 1  5. Pre-diabetes  Continue life style modification

## 2022-03-12 ENCOUNTER — Ambulatory Visit (INDEPENDENT_AMBULATORY_CARE_PROVIDER_SITE_OTHER): Payer: Medicare Other | Admitting: Family Medicine

## 2022-03-12 ENCOUNTER — Encounter: Payer: Self-pay | Admitting: Family Medicine

## 2022-03-12 VITALS — BP 122/82 | HR 85 | Resp 16 | Ht 63.0 in | Wt 144.0 lb

## 2022-03-12 DIAGNOSIS — N1831 Chronic kidney disease, stage 3a: Secondary | ICD-10-CM

## 2022-03-12 DIAGNOSIS — E785 Hyperlipidemia, unspecified: Secondary | ICD-10-CM | POA: Diagnosis not present

## 2022-03-12 DIAGNOSIS — I10 Essential (primary) hypertension: Secondary | ICD-10-CM

## 2022-03-12 DIAGNOSIS — D692 Other nonthrombocytopenic purpura: Secondary | ICD-10-CM

## 2022-03-12 DIAGNOSIS — R7303 Prediabetes: Secondary | ICD-10-CM

## 2022-03-12 MED ORDER — AMLODIPINE-ATORVASTATIN 10-40 MG PO TABS: 1.0000 | ORAL_TABLET | Freq: Every day | ORAL | 1 refills | Status: DC

## 2022-03-12 MED ORDER — OLMESARTAN MEDOXOMIL-HCTZ 40-25 MG PO TABS: 1.0000 | ORAL_TABLET | Freq: Every day | ORAL | 1 refills | Status: DC

## 2022-09-11 ENCOUNTER — Ambulatory Visit: Payer: Medicare Other | Admitting: Family Medicine

## 2022-10-21 ENCOUNTER — Other Ambulatory Visit: Payer: Self-pay | Admitting: Family Medicine

## 2022-10-21 DIAGNOSIS — E785 Hyperlipidemia, unspecified: Secondary | ICD-10-CM

## 2022-10-21 DIAGNOSIS — I10 Essential (primary) hypertension: Secondary | ICD-10-CM

## 2022-10-25 ENCOUNTER — Ambulatory Visit (INDEPENDENT_AMBULATORY_CARE_PROVIDER_SITE_OTHER): Payer: Medicare Other

## 2022-10-25 VITALS — BP 116/68 | Ht 63.0 in | Wt 146.0 lb

## 2022-10-25 DIAGNOSIS — Z0000 Encounter for general adult medical examination without abnormal findings: Secondary | ICD-10-CM

## 2022-10-25 DIAGNOSIS — Z1231 Encounter for screening mammogram for malignant neoplasm of breast: Secondary | ICD-10-CM

## 2022-10-25 DIAGNOSIS — Z13820 Encounter for screening for osteoporosis: Secondary | ICD-10-CM

## 2022-10-25 NOTE — Addendum Note (Signed)
Addended by: Sue Lush on: 10/25/2022 03:09 PM   Modules accepted: Orders

## 2022-10-25 NOTE — Patient Instructions (Addendum)
Ms. Bianca Suarez , Thank you for taking time to come for your Medicare Wellness Visit. I appreciate your ongoing commitment to your health goals. Please review the following plan we discussed and let me know if I can assist you in the future.   These are the goals we discussed:  Goals      Patient Stated     Patient states she would like to maintain overall health and blood pressure within normal limits.         This is a list of the screening recommended for you and due dates:  Health Maintenance  Topic Date Due   Mammogram  04/06/2017   DEXA scan (bone density measurement)  Never done   DTaP/Tdap/Td vaccine (2 - Td or Tdap) 02/10/2021   COVID-19 Vaccine (3 - 2023-24 season) 01/18/2022   Medicare Annual Wellness Visit  10/25/2023   Colon Cancer Screening  10/12/2025   Pneumonia Vaccine  Completed   Hepatitis C Screening  Completed   Zoster (Shingles) Vaccine  Completed   HPV Vaccine  Aged Out   Flu Shot  Discontinued    Advanced directives: no  Conditions/risks identified: low falls risk  Next appointment: Follow up in one year for your annual wellness visit 10/31/2023 @ 2pm in person   Preventive Care 65 Years and Older, Female Preventive care refers to lifestyle choices and visits with your health care provider that can promote health and wellness. What does preventive care include? A yearly physical exam. This is also called an annual well check. Dental exams once or twice a year. Routine eye exams. Ask your health care provider how often you should have your eyes checked. Personal lifestyle choices, including: Daily care of your teeth and gums. Regular physical activity. Eating a healthy diet. Avoiding tobacco and drug use. Limiting alcohol use. Practicing safe sex. Taking low-dose aspirin every day. Taking vitamin and mineral supplements as recommended by your health care provider. What happens during an annual well check? The services and screenings done by your  health care provider during your annual well check will depend on your age, overall health, lifestyle risk factors, and family history of disease. Counseling  Your health care provider may ask you questions about your: Alcohol use. Tobacco use. Drug use. Emotional well-being. Home and relationship well-being. Sexual activity. Eating habits. History of falls. Memory and ability to understand (cognition). Work and work Astronomer. Reproductive health. Screening  You may have the following tests or measurements: Height, weight, and BMI. Blood pressure. Lipid and cholesterol levels. These may be checked every 5 years, or more frequently if you are over 19 years old. Skin check. Lung cancer screening. You may have this screening every year starting at age 61 if you have a 30-pack-year history of smoking and currently smoke or have quit within the past 15 years. Fecal occult blood test (FOBT) of the stool. You may have this test every year starting at age 62. Flexible sigmoidoscopy or colonoscopy. You may have a sigmoidoscopy every 5 years or a colonoscopy every 10 years starting at age 61. Hepatitis C blood test. Hepatitis B blood test. Sexually transmitted disease (STD) testing. Diabetes screening. This is done by checking your blood sugar (glucose) after you have not eaten for a while (fasting). You may have this done every 1-3 years. Bone density scan. This is done to screen for osteoporosis. You may have this done starting at age 75. Mammogram. This may be done every 1-2 years. Talk to your health care  provider about how often you should have regular mammograms. Talk with your health care provider about your test results, treatment options, and if necessary, the need for more tests. Vaccines  Your health care provider may recommend certain vaccines, such as: Influenza vaccine. This is recommended every year. Tetanus, diphtheria, and acellular pertussis (Tdap, Td) vaccine. You may  need a Td booster every 10 years. Zoster vaccine. You may need this after age 40. Pneumococcal 13-valent conjugate (PCV13) vaccine. One dose is recommended after age 87. Pneumococcal polysaccharide (PPSV23) vaccine. One dose is recommended after age 13. Talk to your health care provider about which screenings and vaccines you need and how often you need them. This information is not intended to replace advice given to you by your health care provider. Make sure you discuss any questions you have with your health care provider. Document Released: 06/02/2015 Document Revised: 01/24/2016 Document Reviewed: 03/07/2015 Elsevier Interactive Patient Education  2017 [place] Prevention in the Home Falls can cause injuries. They can happen to people of all ages. There are many things you can do to make your home safe and to help prevent falls. What can I do on the outside of my home? Regularly fix the edges of walkways and driveways and fix any cracks. Remove anything that might make you trip as you walk through a door, such as a raised step or threshold. Trim any bushes or trees on the path to your home. Use bright outdoor lighting. Clear any walking paths of anything that might make someone trip, such as rocks or tools. Regularly check to see if handrails are loose or broken. Make sure that both sides of any steps have handrails. Any raised decks and porches should have guardrails on the edges. Have any leaves, snow, or ice cleared regularly. Use sand or salt on walking paths during winter. Clean up any spills in your garage right away. This includes oil or grease spills. What can I do in the bathroom? Use night lights. Install grab bars by the toilet and in the tub and shower. Do not use towel bars as grab bars. Use non-skid mats or decals in the tub or shower. If you need to sit down in the shower, use a plastic, non-slip stool. Keep the floor dry. Clean up any water that spills on  the floor as soon as it happens. Remove soap buildup in the tub or shower regularly. Attach bath mats securely with double-sided non-slip rug tape. Do not have throw rugs and other things on the floor that can make you trip. What can I do in the bedroom? Use night lights. Make sure that you have a light by your bed that is easy to reach. Do not use any sheets or blankets that are too big for your bed. They should not hang down onto the floor. Have a firm chair that has side arms. You can use this for support while you get dressed. Do not have throw rugs and other things on the floor that can make you trip. What can I do in the kitchen? Clean up any spills right away. Avoid walking on wet floors. Keep items that you use a lot in easy-to-reach places. If you need to reach something above you, use a strong step stool that has a grab bar. Keep electrical cords out of the way. Do not use floor polish or wax that makes floors slippery. If you must use wax, use non-skid floor wax. Do not have throw rugs  and other things on the floor that can make you trip. What can I do with my stairs? Do not leave any items on the stairs. Make sure that there are handrails on both sides of the stairs and use them. Fix handrails that are broken or loose. Make sure that handrails are as long as the stairways. Check any carpeting to make sure that it is firmly attached to the stairs. Fix any carpet that is loose or worn. Avoid having throw rugs at the top or bottom of the stairs. If you do have throw rugs, attach them to the floor with carpet tape. Make sure that you have a light switch at the top of the stairs and the bottom of the stairs. If you do not have them, ask someone to add them for you. What else can I do to help prevent falls? Wear shoes that: Do not have high heels. Have rubber bottoms. Are comfortable and fit you well. Are closed at the toe. Do not wear sandals. If you use a stepladder: Make sure  that it is fully opened. Do not climb a closed stepladder. Make sure that both sides of the stepladder are locked into place. Ask someone to hold it for you, if possible. Clearly mark and make sure that you can see: Any grab bars or handrails. First and last steps. Where the edge of each step is. Use tools that help you move around (mobility aids) if they are needed. These include: Canes. Walkers. Scooters. Crutches. Turn on the lights when you go into a dark area. Replace any light bulbs as soon as they burn out. Set up your furniture so you have a clear path. Avoid moving your furniture around. If any of your floors are uneven, fix them. If there are any pets around you, be aware of where they are. Review your medicines with your doctor. Some medicines can make you feel dizzy. This can increase your chance of falling. Ask your doctor what other things that you can do to help prevent falls. This information is not intended to replace advice given to you by your health care provider. Make sure you discuss any questions you have with your health care provider. Document Released: 03/02/2009 Document Revised: 10/12/2015 Document Reviewed: 06/10/2014 Elsevier Interactive Patient Education  2017 [employer].

## 2022-10-25 NOTE — Progress Notes (Cosign Needed Addendum)
Subjective:   Bianca Suarez is a 72 y.o. female who presents for Medicare Annual (Subsequent) preventive examination.  Review of Systems    Cardiac Risk Factors include: advanced age (>85men, >68 women);hypertension;dyslipidemia    Objective:    Today's Vitals   10/25/22 1430  Weight: 146 lb (66.2 kg)  Height: 5\' 3"  (1.6 m)   Body mass index is 25.86 kg/m.     10/25/2022    2:45 PM 08/23/2021   10:19 AM 10/12/2020    9:02 AM 08/22/2020   10:22 AM 08/19/2019   10:26 AM 10/07/2016   11:30 AM 06/11/2016   11:33 AM  Advanced Directives  Does Patient Have a Medical Advance Directive? No No No No No No No  Would patient like information on creating a medical advance directive?  Yes (MAU/Ambulatory/Procedural Areas - Information given) No - Patient declined No - Patient declined Yes (MAU/Ambulatory/Procedural Areas - Information given)      Current Medications (verified) Outpatient Encounter Medications as of 10/25/2022  Medication Sig   amLODipine-atorvastatin (CADUET) 10-40 MG tablet Take 1 tablet by mouth once daily   aspirin 81 MG tablet Take 1 tablet by mouth daily.   olmesartan-hydrochlorothiazide (BENICAR HCT) 40-25 MG tablet Take 1 tablet by mouth daily.   No facility-administered encounter medications on file as of 10/25/2022.    Allergies (verified) Patient has no known allergies.   History: Past Medical History:  Diagnosis Date   Fibrocystic disease of left breast    Glaucoma    Hyperlipidemia    Hypertension    Lactose intolerance in adult    Left lateral epicondylitis    TIA (transient ischemic attack)    Past Surgical History:  Procedure Laterality Date   ABDOMINAL HYSTERECTOMY  05/20/2002   BIOPSY N/A 10/12/2020   Procedure: BIOPSY;  Surgeon: Toney Reil, MD;  Location: [facility];  Service: Endoscopy;  Laterality: N/A;   COLONOSCOPY WITH PROPOFOL N/A 10/12/2020   Procedure: COLONOSCOPY WITH PROPOFOL;  Surgeon: Toney Reil, MD;   Location: [facility];  Service: Endoscopy;  Laterality: N/A;   COSMETIC SURGERY  2001   lens replacement   ESOPHAGOGASTRODUODENOSCOPY (EGD) WITH PROPOFOL N/A 10/12/2020   Procedure: ESOPHAGOGASTRODUODENOSCOPY (EGD) WITH PROPOFOL;  Surgeon: Toney Reil, MD;  Location: [facility];  Service: Endoscopy;  Laterality: N/A;   EYE SURGERY     FOOT SURGERY Bilateral 09/29/2017   Bone Spurs Cut Down   POLYPECTOMY N/A 10/12/2020   Procedure: POLYPECTOMY;  Surgeon: Toney Reil, MD;  Location: [facility];  Service: Endoscopy;  Laterality: N/A;   Family History  Problem Relation Age of Onset   Diabetes Mother    Hypertension Mother    Hypertension Father    CVA Father    Hypertension Sister    Stroke Maternal Grandmother    Heart disease Paternal Grandmother    Diabetes Paternal Grandmother    Social History   Socioeconomic History   Marital status: Married    Spouse name: Earl Lites   Number of children: 3   Years of education: Not on file   Highest education level: Associate degree: academic program  Occupational History   Not on file  Tobacco Use   Smoking status: Never   Smokeless tobacco: Never  Vaping Use   Vaping Use: Never used  Substance and Sexual Activity   Alcohol use: No    Alcohol/week: 0.0 standard drinks of alcohol   Drug use: No   Sexual activity: Yes  Partners: Male    Birth control/protection: None    Comment: Hysterectomy-Total  Other Topics Concern   Not on file  Social History Narrative   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   Also likes to decorate for events    Daughter recently  moved back in with her husband and their baby   Social Determinants of Health   Financial Resource Strain: Low Risk  (10/25/2022)   Overall Financial Resource Strain (CARDIA)    Difficulty of Paying Living Expenses: Not hard at all  Food Insecurity: No Food Insecurity (10/25/2022)   Hunger Vital Sign     Worried About Running Out of Food in the Last Year: Never true    Ran Out of Food in the Last Year: Never true  Transportation Needs: No Transportation Needs (10/25/2022)   PRAPARE - Administrator, Civil Service (Medical): No    Lack of Transportation (Non-Medical): No  Physical Activity: Insufficiently Active (10/25/2022)   Exercise Vital Sign    Days of Exercise per Week: 1 day    Minutes of Exercise per Session: 30 min  Stress: No Stress Concern Present (10/25/2022)   [employer] of Occupational Health - Occupational Stress Questionnaire    Feeling of Stress : Not at all  Social Connections: Socially Integrated (10/25/2022)   Social Connection and Isolation Panel [NHANES]    Frequency of Communication with Friends and Family: More than three times a week    Frequency of Social Gatherings with Friends and Family: More than three times a week    Attends Religious Services: More than 4 times per year    Active Member of [employer] or Organizations: Yes    Attends Engineer, structural: More than 4 times per year    Marital Status: Married    Tobacco Counseling Counseling given: Not Answered   Clinical Intake:  Pre-visit preparation completed: Yes  Pain : No/denies pain   BMI - recorded: 25.86 Nutritional Status: BMI 25 -29 Overweight Nutritional Risks: None Diabetes: No  How often do you need to have someone help you when you read instructions, pamphlets, or other written materials from your doctor or pharmacy?: 1 - Never  Diabetic?no  Interpreter Needed?: No  Comments: lives with others Information entered by :: B.Reva Pinkley,LPN   Activities of Daily Living    10/25/2022    2:48 PM 03/12/2022    9:02 AM  In your present state of health, do you have any difficulty performing the following activities:  Hearing? 0 0  Vision? 0 0  Difficulty concentrating or making decisions? 0 0  Walking or climbing stairs? 0 0  Dressing or bathing? 0 0  Doing  errands, shopping? 0 0  Preparing Food and eating ? N   Using the Toilet? N   In the past six months, have you accidently leaked urine? N   Do you have problems with loss of bowel control? N   Managing your Medications? N   Managing your Finances? N   Housekeeping or managing your Housekeeping? N     Patient Care Team: Alba Cory, MD as PCP - General (Family Medicine) Felecia Shelling, DPM as Consulting Physician (Podiatry) Blair Promise, [place] (Ophthalmology)  Indicate any recent Medical Services you may have received from other than [facility] providers in the past year (date may be approximate).     Assessment:   This is a routine wellness examination for [facility].  Hearing/Vision screen Hearing  Screening - Comments:: Adequate hearing Vision Screening - Comments:: Adequate vision w/glasses [facility]  Dietary issues and exercise activities discussed: Current Exercise Habits: Home exercise routine, Type of exercise: walking, Time (Minutes): 30, Frequency (Times/Week): 1, Weekly Exercise (Minutes/Week): 30, Intensity: Mild, Exercise limited by: None identified   Goals Addressed             This Visit's Progress    Patient Stated   On track    Patient states she would like to maintain overall health and blood pressure within normal limits.        Depression Screen    10/25/2022    2:39 PM 03/12/2022    9:02 AM 08/23/2021   10:18 AM 08/28/2020    9:52 AM 08/22/2020   10:21 AM 02/28/2020    9:13 AM 08/19/2019   10:24 AM  PHQ 2/9 Scores  PHQ - 2 Score 0 0 0 0 0 0 0  PHQ- 9 Score  0         Fall Risk    10/25/2022    2:34 PM 03/12/2022    9:02 AM 08/23/2021   10:19 AM 08/28/2020    9:52 AM 08/22/2020   10:24 AM  Fall Risk   Falls in the past year? 1 0 0 0 0  Number falls in past yr: 1 0 0 0 0  Injury with Fall?  0 0 0 0  Risk for fall due to : No Fall Risks No Fall Risks No Fall Risks  No Fall Risks  Follow up Education provided;Falls prevention discussed Falls  prevention discussed Falls prevention discussed  Falls prevention discussed    FALL RISK PREVENTION PERTAINING TO THE HOME:  Any stairs in or around the home? No  If so, are there any without handrails? No  Home free of loose throw rugs in walkways, pet beds, electrical cords, etc? Yes  Adequate lighting in your home to reduce risk of falls? Yes   ASSISTIVE DEVICES UTILIZED TO PREVENT FALLS:  Life alert? No  Use of a cane, walker or w/c? No  Grab bars in the bathroom? Yes  Shower chair or bench in shower? No  Elevated toilet seat or a handicapped toilet? Yes   TIMED UP AND GO:  Was the test performed? Yes .  Length of time to ambulate 10 feet: 8 sec.   Gait steady and fast without use of assistive device  Cognitive Function:        10/25/2022    2:50 PM  6CIT Screen  What Year? 0 points  What month? 0 points  What time? 0 points  Count back from 20 0 points  Months in reverse 0 points  Repeat phrase 0 points  Total Score 0 points    Immunizations Immunization History  Administered Date(s) Administered   Janssen (J&J) SARS-COV-2 Vaccination 08/26/2019   PFIZER Comirnaty(Gray Top)Covid-19 Tri-Sucrose Vaccine 06/13/2020   Pneumococcal Conjugate-13 01/21/2018   Pneumococcal Polysaccharide-23 02/09/2019   Tdap 02/11/2011   Zoster Recombinat (Shingrix) 10/07/2016, 06/20/2017    TDAP status: Up to date  Flu Vaccine status: Declined, Education has been provided regarding the importance of this vaccine but patient still declined. Advised may receive this vaccine at local pharmacy or Health Dept. Aware to provide a copy of the vaccination record if obtained from local pharmacy or Health Dept. Verbalized acceptance and understanding.  Pneumococcal vaccine status: Up to date  Covid-19 vaccine status: Completed vaccines  Qualifies for Shingles Vaccine? Yes   Zostavax  completed Yes   Shingrix Completed?: Yes  Screening Tests Health Maintenance  Topic Date Due    MAMMOGRAM  04/06/2017   DEXA SCAN  Never done   DTaP/Tdap/Td (2 - Td or Tdap) 02/10/2021   COVID-19 Vaccine (3 - 2023-24 season) 01/18/2022   Medicare Annual Wellness (AWV)  10/25/2023   Colonoscopy  10/12/2025   Pneumonia Vaccine 40+ Years old  Completed   Hepatitis C Screening  Completed   Zoster Vaccines- Shingrix  Completed   HPV VACCINES  Aged Out   INFLUENZA VACCINE  Discontinued    Health Maintenance  Health Maintenance Due  Topic Date Due   MAMMOGRAM  04/06/2017   DEXA SCAN  Never done   DTaP/Tdap/Td (2 - Td or Tdap) 02/10/2021   COVID-19 Vaccine (3 - 2023-24 season) 01/18/2022    Colorectal cancer screening: Type of screening: Colonoscopy. Completed yes. Repeat every 5 years  Mammogram status: Ordered yes. Pt provided with contact info and advised to call to schedule appt.   Bone Density status: Ordered yes. Pt provided with contact info and advised to call to schedule appt.  Lung Cancer Screening: (Low Dose CT Chest recommended if Age 85-80 years, 30 pack-year currently smoking OR have quit w/in 15years.) does not qualify.   Lung Cancer Screening Referral: no  Additional Screening:  Hepatitis C Screening: does not qualify; Completed yes  Vision Screening: Recommended annual ophthalmology exams for early detection of glaucoma and other disorders of the eye. Is the patient up to date with their annual eye exam?  Yes  Who is the provider or what is the name of the office in which the patient attends annual eye exams? [facility] If pt is not established with a provider, would they like to be referred to a provider to establish care? No .   Dental Screening: Recommended annual dental exams for proper oral hygiene  Community Resource Referral / Chronic Care Management: CRR required this visit?  No   CCM required this visit?  No      Plan:     I have personally reviewed and noted the following in the patient's chart:   Medical and social  history Use of alcohol, tobacco or illicit drugs  Current medications and supplements including opioid prescriptions. Patient is not currently taking opioid prescriptions. Functional ability and status Nutritional status Physical activity Advanced directives List of other physicians Hospitalizations, surgeries, and ER visits in previous 12 months Vitals Screenings to include cognitive, depression, and falls Referrals and appointments  In addition, I have reviewed and discussed with patient certain preventive protocols, quality metrics, and best practice recommendations. A written personalized care plan for preventive services as well as general preventive health recommendations were provided to patient.    Sue Lush, LPN   01/23/453   Nurse Notes: The patient states she is doing well and has no concerns or questions at this time  *referral for MMG and Dexa made

## 2022-11-07 NOTE — Progress Notes (Signed)
Name: Bianca Suarez   MRN: [id number]    DOB: August 29, 1950   Date:11/11/2022       Progress Note  Subjective  Chief Complaint  Follow Up  HPI  HTN and hyperlipidemia: taking medication as prescribed, she denies side effects. No chest pain or palpitation. We will recheck labs   CKI: she is taking ARB, we will continue to monitor, avoid NSAID's continue drinking water . We will recheck labs. . She has a good urine output   Pre-diabetes: A1C has been stable between 6.2% and 6.3 %. Last visit it was 6.2 %  She denies polyphagia, polydipsia or polyuria. Discussed importance of life style modification. She stopped drinking sodas and eating a lot of chocolate - and diarrhea resolved, she feels better   Hyperlipidemia: reviewed labs with patient last LDL was up from 71 to 85. She is taking Caduet no side effects. She has a history of TIA but no recent problems  goal LDL is below 70, we will recheck labs    OA: she states right knee no longer swelling , she states instability not as noticeable since she downsized to one level plan. No pain at this time   Senile purpura: she states intermittent bruises, doing better since only taking aspirin 81 mg about once a week instead of daily . 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   H/O transient cerebral ischemia 12/24/2014   Benign hypertension 12/24/2014   Lateral epicondylitis of left elbow 12/24/2014    Past Surgical History:  Procedure Laterality Date   ABDOMINAL HYSTERECTOMY  05/20/2002   BIOPSY N/A 10/12/2020   Procedure: BIOPSY;  Surgeon: Toney Reil, MD;  Location: [facility];   Service: Endoscopy;  Laterality: N/A;   COLONOSCOPY WITH PROPOFOL N/A 10/12/2020   Procedure: COLONOSCOPY WITH PROPOFOL;  Surgeon: Toney Reil, MD;  Location: [facility];  Service: Endoscopy;  Laterality: N/A;   COSMETIC SURGERY  2001   lens replacement   ESOPHAGOGASTRODUODENOSCOPY (EGD) WITH PROPOFOL N/A 10/12/2020   Procedure: ESOPHAGOGASTRODUODENOSCOPY (EGD) WITH PROPOFOL;  Surgeon: Toney Reil, MD;  Location: [facility];  Service: Endoscopy;  Laterality: N/A;   EYE SURGERY     FOOT SURGERY Bilateral 09/29/2017   Bone Spurs Cut Down   POLYPECTOMY N/A 10/12/2020   Procedure: POLYPECTOMY;  Surgeon: Toney Reil, MD;  Location: [facility];  Service: Endoscopy;  Laterality: N/A;    Family History  Problem Relation Age of Onset   Diabetes Mother    Hypertension Mother    Hypertension Father    CVA Father    Hypertension Sister    Stroke Maternal Grandmother    Heart disease Paternal Grandmother    Diabetes Paternal Grandmother     Social History   Tobacco Use   Smoking status: Never   Smokeless tobacco: Never  Substance Use Topics   Alcohol use: No    Alcohol/week: 0.0 standard drinks of alcohol     Current Outpatient Medications:    amLODipine-atorvastatin (CADUET) 10-40 MG tablet, Take 1 tablet by mouth once daily, Disp: 90 tablet, Rfl: 0   olmesartan-hydrochlorothiazide (BENICAR HCT) 40-25 MG tablet, Take  1 tablet by mouth daily., Disp: 90 tablet, Rfl: 1  No Known Allergies  I personally reviewed active problem list, medication list, allergies, family history, social history, health maintenance with the patient/caregiver today.   ROS  Ten systems reviewed and is negative except as mentioned in HPI   Objective  Vitals:   11/11/22 1419  BP: 124/70  Pulse: 94  Resp: 16  SpO2: 98%  Weight: 145 lb (65.8 kg)  Height: 5\' 3"  (1.6 m)    Body mass index is 25.69 kg/m.  Physical Exam  1. Stage 3a chronic kidney  disease (HCC)  - CBC with Differential/Platelet - COMPLETE METABOLIC PANEL WITH GFR - VITAMIN D 25 Hydroxy (Vit-D Deficiency, Fractures) - Parathyroid hormone, intact (no Ca) - amLODipine-atorvastatin (CADUET) 10-40 MG tablet; Take 1 tablet by mouth daily.  Dispense: 90 tablet; Refill: 1 - olmesartan-hydrochlorothiazide (BENICAR HCT) 40-25 MG tablet; Take 1 tablet by mouth daily.  Dispense: 90 tablet; Refill: 1  2. Dyslipidemia  - Lipid panel - amLODipine-atorvastatin (CADUET) 10-40 MG tablet; Take 1 tablet by mouth daily.  Dispense: 90 tablet; Refill: 1  3. Senile purpura (HCC)  Reassurance   4. Pre-diabetes  - Hemoglobin A1c  5. Hypertension, benign  - CBC with Differential/Platelet - COMPLETE METABOLIC PANEL WITH GFR - amLODipine-atorvastatin (CADUET) 10-40 MG tablet; Take 1 tablet by mouth daily.  Dispense: 90 tablet; Refill: 1 - olmesartan-hydrochlorothiazide (BENICAR HCT) 40-25 MG tablet; Take 1 tablet by mouth daily.  Dispense: 90 tablet; Refill: 1    PHQ2/9:    11/11/2022    2:19 PM 10/25/2022    2:39 PM 03/12/2022    9:02 AM 08/23/2021   10:18 AM 08/28/2020    9:52 AM  Depression screen PHQ 2/9  Decreased Interest 0 0 0 0 0  Down, Depressed, Hopeless 0 0 0 0 0  PHQ - 2 Score 0 0 0 0 0  Altered sleeping 0  0    Tired, decreased energy 0  0    Change in appetite 0  0    Feeling bad or failure about yourself  0  0    Trouble concentrating 0  0    Moving slowly or fidgety/restless 0  0    Suicidal thoughts 0  0    PHQ-9 Score 0  0      phq 9 is negative   Fall Risk:    11/11/2022    2:19 PM 10/25/2022    2:34 PM 03/12/2022    9:02 AM 08/23/2021   10:19 AM 08/28/2020    9:52 AM  Fall Risk   Falls in the past year? 1 1 0 0 0  Number falls in past yr: 0 1 0 0 0  Injury with Fall? 0  0 0 0  Risk for fall due to : No Fall Risks No Fall Risks No Fall Risks No Fall Risks   Follow up Falls prevention discussed Education provided;Falls prevention discussed Falls  prevention discussed Falls prevention discussed       Functional Status Survey: Is the patient deaf or have difficulty hearing?: No Does the patient have difficulty seeing, even when wearing glasses/contacts?: No Does the patient have difficulty concentrating, remembering, or making decisions?: No Does the patient have difficulty walking or climbing stairs?: No Does the patient have difficulty dressing or bathing?: No Does the patient have difficulty doing errands alone such as visiting a doctor's office or shopping?: No    Assessment & Plan  1. Stage 3a chronic  kidney disease (HCC)  - CBC with Differential/Platelet - COMPLETE METABOLIC PANEL WITH GFR - VITAMIN D 25 Hydroxy (Vit-D Deficiency, Fractures) - Parathyroid hormone, intact (no Ca) - amLODipine-atorvastatin (CADUET) 10-40 MG tablet; Take 1 tablet by mouth daily.  Dispense: 90 tablet; Refill: 1 - olmesartan-hydrochlorothiazide (BENICAR HCT) 40-25 MG tablet; Take 1 tablet by mouth daily.  Dispense: 90 tablet; Refill: 1 - Urine Microalbumin w/creat. ratio  2. Dyslipidemia  - Lipid panel - amLODipine-atorvastatin (CADUET) 10-40 MG tablet; Take 1 tablet by mouth daily.  Dispense: 90 tablet; Refill: 1  3. Senile purpura (HCC)  Reassurance given   4. Pre-diabetes  - Hemoglobin A1c  5. Hypertension, benign  - CBC with Differential/Platelet - COMPLETE METABOLIC PANEL WITH GFR - amLODipine-atorvastatin (CADUET) 10-40 MG tablet; Take 1 tablet by mouth daily.  Dispense: 90 tablet; Refill: 1 - olmesartan-hydrochlorothiazide (BENICAR HCT) 40-25 MG tablet; Take 1 tablet by mouth daily.  Dispense: 90 tablet; Refill: 1

## 2022-11-11 ENCOUNTER — Ambulatory Visit (INDEPENDENT_AMBULATORY_CARE_PROVIDER_SITE_OTHER): Payer: Medicare Other | Admitting: Family Medicine

## 2022-11-11 ENCOUNTER — Encounter: Payer: Self-pay | Admitting: Family Medicine

## 2022-11-11 VITALS — BP 124/70 | HR 94 | Resp 16 | Ht 63.0 in | Wt 145.0 lb

## 2022-11-11 DIAGNOSIS — N1831 Chronic kidney disease, stage 3a: Secondary | ICD-10-CM | POA: Diagnosis not present

## 2022-11-11 DIAGNOSIS — E785 Hyperlipidemia, unspecified: Secondary | ICD-10-CM | POA: Diagnosis not present

## 2022-11-11 DIAGNOSIS — I10 Essential (primary) hypertension: Secondary | ICD-10-CM

## 2022-11-11 DIAGNOSIS — R7303 Prediabetes: Secondary | ICD-10-CM

## 2022-11-11 DIAGNOSIS — D692 Other nonthrombocytopenic purpura: Secondary | ICD-10-CM

## 2022-11-11 MED ORDER — OLMESARTAN MEDOXOMIL-HCTZ 40-25 MG PO TABS
1.0000 | ORAL_TABLET | Freq: Every day | ORAL | 1 refills | Status: DC
Start: 2022-11-11 — End: 2023-06-13

## 2022-11-11 MED ORDER — AMLODIPINE-ATORVASTATIN 10-40 MG PO TABS: 1.0000 | ORAL_TABLET | Freq: Every day | ORAL | 1 refills | Status: DC

## 2022-11-11 NOTE — Patient Instructions (Signed)
Tdap at local pharmacy.

## 2022-11-12 LAB — COMPLETE METABOLIC PANEL WITH GFR
AG Ratio: 1.4 (calc) (ref 1.0–2.5)
ALT: 20 U/L (ref 6–29)
AST: 20 U/L (ref 10–35)
Albumin: 4.3 g/dL (ref 3.6–5.1)
Alkaline phosphatase (APISO): 106 U/L (ref 37–153)
BUN/Creatinine Ratio: 19 (calc) (ref 6–22)
BUN: 22 mg/dL (ref 7–25)
CO2: 25 mmol/L (ref 20–32)
Calcium: 9.6 mg/dL (ref 8.6–10.4)
Chloride: 104 mmol/L (ref 98–110)
Creat: 1.16 mg/dL — ABNORMAL HIGH (ref 0.50–1.05)
Globulin: 3.1 g/dL (calc) (ref 1.9–3.7)
Glucose, Bld: 98 mg/dL (ref 65–139)
Potassium: 3.8 mmol/L (ref 3.5–5.3)
Sodium: 139 mmol/L (ref 135–146)
Total Bilirubin: 0.7 mg/dL (ref 0.2–1.2)
Total Protein: 7.4 g/dL (ref 6.1–8.1)
eGFR: 51 mL/min/{1.73_m2} — ABNORMAL LOW (ref >=60)

## 2022-11-12 LAB — CBC WITH DIFFERENTIAL/PLATELET
Absolute Monocytes: 524 cells/uL (ref 200–950)
Basophils Absolute: 48 cells/uL (ref 0–200)
Basophils Relative: 0.7 %
Eosinophils Absolute: 117 cells/uL (ref 15–500)
Eosinophils Relative: 1.7 %
HCT: 35.6 % (ref 35.0–45.0)
Hemoglobin: 11.8 g/dL (ref 11.7–15.5)
Lymphs Abs: 2049 cells/uL (ref 850–3900)
MCH: 29.2 pg (ref 27.0–33.0)
MCHC: 33.1 g/dL (ref 32.0–36.0)
MCV: 88.1 fL (ref 80.0–100.0)
MPV: 9.4 fL (ref 7.5–12.5)
Monocytes Relative: 7.6 %
Neutro Abs: 4161 cells/uL (ref 1500–7800)
Neutrophils Relative %: 60.3 %
Platelets: 325 10*3/uL (ref 140–400)
RBC: 4.04 10*6/uL (ref 3.80–5.10)
RDW: 12.5 % (ref 11.0–15.0)
Total Lymphocyte: 29.7 %
WBC: 6.9 10*3/uL (ref 3.8–10.8)

## 2022-11-12 LAB — VITAMIN D 25 HYDROXY (VIT D DEFICIENCY, FRACTURES): Vit D, 25-Hydroxy: 34 ng/mL (ref 30–100)

## 2022-11-12 LAB — LIPID PANEL
Cholesterol: 137 mg/dL (ref <200)
HDL: 56 mg/dL (ref >=50)
LDL Cholesterol (Calc): 67 mg/dL (calc)
Non-HDL Cholesterol (Calc): 81 mg/dL (calc) (ref <130)
Total CHOL/HDL Ratio: 2.4 (calc) (ref <5.0)
Triglycerides: 67 mg/dL (ref <150)

## 2022-11-12 LAB — HEMOGLOBIN A1C
Hgb A1c MFr Bld: 6.4 % of total Hgb — ABNORMAL HIGH (ref <5.7)
Mean Plasma Glucose: 137 mg/dL
eAG (mmol/L): 7.6 mmol/L

## 2022-11-12 LAB — PARATHYROID HORMONE, INTACT (NO CA): PTH: 70 pg/mL (ref 16–77)

## 2022-11-12 LAB — MICROALBUMIN / CREATININE URINE RATIO
Creatinine, Urine: 133 mg/dL (ref 20–275)
Microalb Creat Ratio: 2 mg/g creat (ref <30)
Microalb, Ur: 0.3 mg/dL

## 2022-12-31 ENCOUNTER — Ambulatory Visit
Admission: RE | Admit: 2022-12-31 | Discharge: 2022-12-31 | Disposition: A | Discharge status: Home / Self Care | Payer: Medicare Other | Source: Ambulatory Visit | Attending: Family Medicine | Admitting: Family Medicine

## 2022-12-31 DIAGNOSIS — Z1231 Encounter for screening mammogram for malignant neoplasm of breast: Secondary | ICD-10-CM | POA: Diagnosis not present

## 2023-04-28 ENCOUNTER — Ambulatory Visit: Payer: Medicare Other | Admitting: Family Medicine

## 2023-05-26 ENCOUNTER — Ambulatory Visit: Payer: Medicare Other | Admitting: Family Medicine

## 2023-06-13 ENCOUNTER — Other Ambulatory Visit: Payer: Self-pay | Admitting: Family Medicine

## 2023-06-13 DIAGNOSIS — I10 Essential (primary) hypertension: Secondary | ICD-10-CM

## 2023-06-13 DIAGNOSIS — N1831 Chronic kidney disease, stage 3a: Secondary | ICD-10-CM

## 2023-06-16 ENCOUNTER — Ambulatory Visit
Admission: RE | Admit: 2023-06-16 | Discharge: 2023-06-16 | Disposition: A | Discharge status: Home / Self Care | Payer: Medicare Other | Source: Ambulatory Visit | Attending: Family Medicine | Admitting: Family Medicine

## 2023-06-16 DIAGNOSIS — Z90722 Acquired absence of ovaries, bilateral: Secondary | ICD-10-CM | POA: Diagnosis not present

## 2023-06-16 DIAGNOSIS — N958 Other specified menopausal and perimenopausal disorders: Secondary | ICD-10-CM | POA: Diagnosis not present

## 2023-06-16 DIAGNOSIS — E2839 Other primary ovarian failure: Secondary | ICD-10-CM | POA: Diagnosis not present

## 2023-06-16 DIAGNOSIS — M8588 Other specified disorders of bone density and structure, other site: Secondary | ICD-10-CM | POA: Diagnosis not present

## 2023-09-24 ENCOUNTER — Ambulatory Visit
Admission: EM | Admit: 2023-09-24 | Discharge: 2023-09-24 | Disposition: A | Discharge status: Home / Self Care | Attending: Emergency Medicine | Admitting: Emergency Medicine

## 2023-09-24 DIAGNOSIS — Z23 Encounter for immunization: Secondary | ICD-10-CM | POA: Diagnosis not present

## 2023-09-24 DIAGNOSIS — S61012A Laceration without foreign body of left thumb without damage to nail, initial encounter: Secondary | ICD-10-CM | POA: Diagnosis not present

## 2023-09-24 DIAGNOSIS — I10 Essential (primary) hypertension: Secondary | ICD-10-CM

## 2023-09-24 MED ORDER — TETANUS-DIPHTH-ACELL PERTUSSIS 5-2.5-18.5 LF-MCG/0.5 IM SUSY
0.5000 mL | PREFILLED_SYRINGE | Freq: Once | INTRAMUSCULAR | Status: AC
  Administered 2023-09-24: 0.5 mL via INTRAMUSCULAR

## 2023-09-24 NOTE — Discharge Instructions (Addendum)
 Your tetanus was updated today.  Keep your wound clean and dry.  Wash it gently twice a day with soap and water .  Your stitches need to be taken out in 7 to 10 days.  Return right away if you note signs of infection such as redness, pus-like drainage, fever.  See the attached information on laceration care.  Follow up right away if you see signs of infection, such as redness, pus-like drainage, fever, or other concerning symptoms.     Your blood pressure is elevated today at 185/95.  Please have this rechecked by your primary care provider this week.     See the attached information on managing your hypertension.

## 2023-09-24 NOTE — ED Triage Notes (Addendum)
 Pt reports she was cutting some tile with an open blade and forget it was open and cut her left thumb about 30 min ago.    Last tetanus 2012

## 2023-09-24 NOTE — ED Provider Notes (Signed)
 Bianca Suarez    CSN: [id number] Arrival date & time: 09/24/23  1229      History   Chief Complaint Chief Complaint  Patient presents with   Laceration    HPI Bianca Suarez is a 73 y.o. female.  Patient presents with a laceration on her left thumb x 30 minutes.  She was using a blade to cut tile and accidentally cut her thumb.  Bleeding controlled with direct pressure.  No numbness or weakness.  Last tetanus 2012.  The history is provided by the patient and medical records.    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   H/O transient cerebral ischemia 12/24/2014   Benign hypertension 12/24/2014   Lateral epicondylitis of left elbow 12/24/2014    Past Surgical History:  Procedure Laterality Date   ABDOMINAL HYSTERECTOMY  05/20/2002   BIOPSY N/A 10/12/2020   Procedure: BIOPSY;  Surgeon: Selena Daily, MD;  Location: [facility];  Service: Endoscopy;  Laterality: N/A;   COLONOSCOPY WITH PROPOFOL  N/A 10/12/2020   Procedure: COLONOSCOPY WITH PROPOFOL ;  Surgeon: Selena Daily, MD;  Location: [facility];  Service: Endoscopy;  Laterality: N/A;   COSMETIC SURGERY  2001   lens replacement   ESOPHAGOGASTRODUODENOSCOPY (EGD) WITH PROPOFOL  N/A 10/12/2020   Procedure: ESOPHAGOGASTRODUODENOSCOPY (EGD) WITH PROPOFOL ;  Surgeon: Selena Daily, MD;  Location: [facility];  Service: Endoscopy;  Laterality: N/A;    EYE SURGERY     FOOT SURGERY Bilateral 09/29/2017   Bone Spurs Cut Down   POLYPECTOMY N/A 10/12/2020   Procedure: POLYPECTOMY;  Surgeon: Selena Daily, MD;  Location: [facility];  Service: Endoscopy;  Laterality: N/A;    OB History   No obstetric history on file.      Home Medications    Prior to Admission medications   Medication Sig Start Date End Date Taking? Authorizing Provider  amLODipine -atorvastatin  (CADUET ) 10-40 MG tablet Take 1 tablet by mouth daily. 11/11/22   Sowles, Krichna, MD  olmesartan -hydrochlorothiazide  (BENICAR  HCT) 40-25 MG tablet Take 1 tablet by mouth once daily 06/13/23   Sowles, Krichna, MD    Family History Family History  Problem Relation Age of Onset   Diabetes Mother    Hypertension Mother    Hypertension Father    CVA Father    Hypertension Sister    Stroke Maternal Grandmother    Heart disease Paternal Grandmother    Diabetes Paternal Grandmother     Social History Social History   Tobacco Use   Smoking status: Never   Smokeless tobacco: Never  Vaping Use   Vaping status: Never Used  Substance Use Topics   Alcohol use: No    Alcohol/week: 0.0 standard drinks of alcohol   Drug use: No     Allergies   Patient has no known allergies.   Review of Systems Review of Systems  Constitutional:  Negative for chills and fever.  Musculoskeletal:  Negative for arthralgias and joint swelling.  Skin:  Positive for wound. Negative for color change.  Neurological:  Negative for weakness and numbness.     Physical Exam Triage Vital Signs ED Triage Vitals [09/24/23 1307]  Encounter Vitals Group     BP      Systolic BP Percentile      Diastolic BP Percentile      Pulse      Resp      Temp      Temp src      SpO2      Weight      Height      Head Circumference      Peak Flow      Pain Score 4     Pain Loc      Pain Education      Exclude from Growth Chart    No data found.  Updated Vital Signs BP (!) 163/95    Pulse 69   Temp 98.3 F (36.8 C) (Oral)   Resp 20   SpO2 98%   Visual Acuity Right Eye Distance:   Left Eye Distance:   Bilateral Distance:    Right Eye Near:   Left Eye Near:    Bilateral Near:     Physical Exam Constitutional:      General: She is not in acute distress. HENT:     Mouth/Throat:     Mouth: Mucous membranes are moist.  Cardiovascular:     Rate and Rhythm: Normal rate and regular rhythm.  Pulmonary:     Effort: Pulmonary effort is normal. No respiratory distress.  Musculoskeletal:        General: No deformity. Normal range of motion.  Skin:    General: Skin is warm and dry.     Capillary Refill: Capillary refill takes less than 2 seconds.     Findings: Lesion present. No erythema.     Comments: 1 cm laceration on left thumb.  Bleeding controlled.  Neurological:     General: No focal deficit present.     Mental Status: She is alert.     Sensory: No sensory deficit.     Motor: No weakness.      UC Treatments / Results  Labs (all labs ordered are listed, but only abnormal results are displayed) Labs Reviewed - No data to display  EKG   Radiology No results found.  Procedures Laceration Repair  Date/Time: 09/24/2023 1:44 PM  Performed by: Wellington Half, NP Authorized by: Wellington Half, NP   Consent:    Consent obtained:  Verbal   Consent given by:  Patient   Risks discussed:  Infection, poor cosmetic result, pain and poor wound healing Universal protocol:    Procedure explained and questions answered to patient or proxy's satisfaction: yes   Anesthesia:    Anesthesia method:  Local infiltration   Local anesthetic:  Lidocaine  1% w/o epi Laceration details:    Location:  Finger   Finger location:  L thumb   Length (cm):  1   Depth (mm):  2 Pre-procedure details:    Preparation:  Patient was prepped and draped in usual sterile fashion Exploration:    Hemostasis achieved with:  Direct pressure   Imaging outcome: foreign body not  noted     Wound exploration: wound explored through full range of motion and entire depth of wound visualized   Treatment:    Area cleansed with:  Povidone-iodine   Amount of  cleaning:  Standard   Irrigation solution:  Sterile water    Irrigation method:  Syringe   Visualized foreign bodies/material removed: no   Skin repair:    Repair method:  Sutures   Suture size:  4-0   Suture material:  Nylon   Suture technique:  Simple interrupted   Number of sutures:  3 Approximation:    Approximation:  Close Repair type:    Repair type:  Simple Post-procedure details:    Dressing:  Antibiotic ointment and non-adherent dressing   Procedure completion:  Tolerated well, no immediate complications  (including critical care time)  Medications Ordered in UC Medications  Tdap (BOOSTRIX) injection 0.5 mL (has no administration in time range)    Initial Impression / Assessment and Plan / UC Course  I have reviewed the triage vital signs and the nursing notes.  Pertinent labs & imaging results that were available during my care of the patient were reviewed by me and considered in my medical decision making (see chart for details).   Laceration of left thumb, Elevated blood pressure with hypertension.  3 sutures.  Tetanus updated today.  Wound care instructions and signs of infection discussed.  Instructed patient to return here for suture removal in 7 to 10 days.  Instructed her to return right away if she notes signs of infection.  Education provided on laceration care.  Also discussed with patient that her blood pressure is elevated today and needs to be rechecked by her PCP this week.  Education provided on managing hypertension.  She agrees to plan of care.   Final Clinical Impressions(s) / UC Diagnoses   Final diagnoses:  Laceration of left thumb without foreign body without damage to nail, initial encounter  Elevated blood pressure reading in office with diagnosis of hypertension      Discharge Instructions      Your tetanus was updated today.  Keep your wound clean and dry.  Wash it gently twice a day with soap and water .  Your stitches need to be taken out in 7 to 10 days.  Return right away if you note signs of infection such as redness, pus-like drainage, fever.  See the attached information on laceration care.  Follow up right away if you see signs of infection, such as redness, pus-like drainage, fever, or other concerning symptoms.     Your blood pressure is elevated today at 185/95.  Please have this rechecked by your primary care provider this week.     See the attached information on managing your hypertension.      ED Prescriptions   None    PDMP not reviewed this encounter.   Wellington Half, NP 09/24/23 1346

## 2023-10-01 ENCOUNTER — Ambulatory Visit
Admission: EM | Admit: 2023-10-01 | Discharge: 2023-10-01 | Disposition: A | Discharge status: Home / Self Care | Attending: Family Medicine | Admitting: Family Medicine

## 2023-10-01 DIAGNOSIS — Z4802 Encounter for removal of sutures: Secondary | ICD-10-CM | POA: Diagnosis not present

## 2024-03-05 ENCOUNTER — Ambulatory Visit: Payer: Self-pay

## 2024-03-06 ENCOUNTER — Other Ambulatory Visit: Payer: Self-pay | Admitting: Family Medicine

## 2024-03-06 DIAGNOSIS — N1831 Chronic kidney disease, stage 3a: Secondary | ICD-10-CM

## 2024-03-06 DIAGNOSIS — I10 Essential (primary) hypertension: Secondary | ICD-10-CM

## 2024-05-21 ENCOUNTER — Ambulatory Visit

## 2024-05-21 VITALS — BP 172/110 | Ht 63.0 in | Wt 137.7 lb

## 2024-05-21 DIAGNOSIS — Z0001 Encounter for general adult medical examination with abnormal findings: Secondary | ICD-10-CM

## 2024-05-21 DIAGNOSIS — Z0000 Encounter for general adult medical examination without abnormal findings: Secondary | ICD-10-CM

## 2024-05-21 DIAGNOSIS — N1831 Chronic kidney disease, stage 3a: Secondary | ICD-10-CM | POA: Diagnosis not present

## 2024-05-21 DIAGNOSIS — E785 Hyperlipidemia, unspecified: Secondary | ICD-10-CM

## 2024-05-21 DIAGNOSIS — I10 Essential (primary) hypertension: Secondary | ICD-10-CM | POA: Diagnosis not present

## 2024-05-21 NOTE — Patient Instructions (Addendum)
 Ms. Mcaulay,  Thank you for taking the time for your Medicare Wellness Visit. I appreciate your continued commitment to your health goals. Please review the care plan we discussed, and feel free to reach out if I can assist you further.  Please note that Annual Wellness Visits do not include a physical exam. Some assessments may be limited, especially if the visit was conducted virtually. If needed, we may recommend an in-person follow-up with your provider.  Ongoing Care Seeing your primary care provider every 3 to 6 months helps us  monitor your health and provide consistent, personalized care.   Referrals If a referral was made during today's visit and you haven't received any updates within two weeks, please contact the referred provider directly to check on the status. APPT W/ DR.SOWLES 06/01/24 @ 2:20 P.M. FOR A PHYSICAL  Recommended Screenings:  Health Maintenance  Topic Date Due   Flu Shot  Never done   COVID-19 Vaccine (3 - 2025-26 season) 01/19/2024   Breast Cancer Screening  12/30/2024   Medicare Annual Wellness Visit  05/21/2025   Colon Cancer Screening  10/12/2025   DTaP/Tdap/Td vaccine (3 - Td or Tdap) 09/23/2033   Pneumococcal Vaccine for age over 9  Completed   Osteoporosis screening with Bone Density Scan  Completed   Hepatitis C Screening  Completed   Zoster (Shingles) Vaccine  Completed   Meningitis B Vaccine  Aged Out     Vision: Annual vision screenings are recommended for early detection of glaucoma, cataracts, and diabetic retinopathy. These exams can also reveal signs of chronic conditions such as diabetes and high blood pressure.  Dental: Annual dental screenings help detect early signs of oral cancer, gum disease, and other conditions linked to overall health, including heart disease and diabetes.  Please see the attached documents for additional preventive care recommendations.   NEXT AWV 05/26/25 @ 8:50 A.M. IN PERSON

## 2024-05-21 NOTE — Progress Notes (Signed)
 "  Chief Complaint  Patient presents with   Medicare Wellness     Subjective:   Bianca Suarez is a 74 y.o. female who presents for a Medicare Annual Wellness Visit.  Visit info / Clinical Intake: Medicare Wellness Visit Type:: Subsequent Annual Wellness Visit Persons participating in visit and providing information:: patient Medicare Wellness Visit Mode:: In-person (required for WTM) Interpreter Needed?: No Pre-visit prep was completed: yes AWV questionnaire completed by patient prior to visit?: no Living arrangements:: lives with spouse/significant other Patient's Overall Health Status Rating: good Typical amount of pain: none Does pain affect daily life?: no Are you currently prescribed opioids?: no  Dietary Habits and Nutritional Risks How many meals a day?: 3 Eats fruit and vegetables daily?: (!) no (EATS FRUITS INFREQUENTLY- LIKES VEGGIES) Most meals are obtained by: eating out (BREAKFAST AT HOME) In the last 2 weeks, have you had any of the following?: none Diabetic:: no  Functional Status Activities of Daily Living (to include ambulation/medication): Independent Ambulation: Independent Medication Administration: Independent Home Management (perform basic housework or laundry): Independent Manage your own finances?: yes Primary transportation is: driving Concerns about vision?: no *vision screening is required for WTM* (WEARS GLASSES FOR DRIVING- [facility]) Concerns about hearing?: no  Fall Screening Falls in the past year?: 0 Number of falls in past year: 0 Was there an injury with Fall?: 0 Fall Risk Category Calculator: 0 Patient Fall Risk Level: Low Fall Risk  Fall Risk Patient at Risk for Falls Due to: No Fall Risks Fall risk Follow up: Falls evaluation completed; Falls prevention discussed  Home and Transportation Safety: All rugs have non-skid backing?: yes All stairs or steps have railings?: yes Grab bars in the bathtub or shower?: (!) no Have  non-skid surface in bathtub or shower?: yes Good home lighting?: yes Regular seat belt use?: yes Hospital stays in the last year:: no  Cognitive Assessment Difficulty concentrating, remembering, or making decisions? : no Will 6CIT or Mini Cog be Completed: yes What year is it?: 0 points What month is it?: 0 points Give patient an address phrase to remember (5 components): [address] [place], [place] About what time is it?: 0 points Count backwards from 20 to 1: 0 points Say the months of the year in reverse: 0 points Repeat the address phrase from earlier: 0 points 6 CIT Score: 0 points  Advance Directives (For Healthcare) Does Patient Have a Medical Advance Directive?: No Would patient like information on creating a medical advance directive?: No - Patient declined  Reviewed/Updated  Reviewed/Updated: Reviewed All (Medical, Surgical, Family, Medications, Allergies, Care Teams, Patient Goals)    Allergies (verified) Patient has no known allergies.   Current Medications (verified) Outpatient Encounter Medications as of 05/21/2024  Medication Sig   olmesartan -hydrochlorothiazide  (BENICAR  HCT) 40-25 MG tablet Take 1 tablet by mouth once daily   amLODipine -atorvastatin  (CADUET ) 10-40 MG tablet Take 1 tablet by mouth daily. (Patient not taking: Reported on 05/21/2024)   No facility-administered encounter medications on file as of 05/21/2024.    History: Past Medical History:  Diagnosis Date   Fibrocystic disease of left breast    Glaucoma    Hyperlipidemia    Hypertension    Lactose intolerance in adult    Left lateral epicondylitis    TIA (transient ischemic attack)    Past Surgical History:  Procedure Laterality Date   ABDOMINAL HYSTERECTOMY  05/20/2002   BIOPSY N/A 10/12/2020   Procedure: BIOPSY;  Surgeon: Unk Corinn Skiff, MD;  Location: [facility];  Service: Endoscopy;  Laterality: N/A;   COLONOSCOPY WITH PROPOFOL  N/A 10/12/2020   Procedure: COLONOSCOPY  WITH PROPOFOL ;  Surgeon: Unk Corinn Skiff, MD;  Location: [facility];  Service: Endoscopy;  Laterality: N/A;   COSMETIC SURGERY  2001   lens replacement   ESOPHAGOGASTRODUODENOSCOPY (EGD) WITH PROPOFOL  N/A 10/12/2020   Procedure: ESOPHAGOGASTRODUODENOSCOPY (EGD) WITH PROPOFOL ;  Surgeon: Unk Corinn Skiff, MD;  Location: [facility];  Service: Endoscopy;  Laterality: N/A;   EYE SURGERY     FOOT SURGERY Bilateral 09/29/2017   Bone Spurs Cut Down   POLYPECTOMY N/A 10/12/2020   Procedure: POLYPECTOMY;  Surgeon: Unk Corinn Skiff, MD;  Location: [facility];  Service: Endoscopy;  Laterality: N/A;   Family History  Problem Relation Age of Onset   Diabetes Mother    Hypertension Mother    Hypertension Father    CVA Father    Hypertension Sister    Stroke Maternal Grandmother    Heart disease Paternal Grandmother    Diabetes Paternal Grandmother    Social History   Occupational History   Not on file  Tobacco Use   Smoking status: Never   Smokeless tobacco: Never  Vaping Use   Vaping status: Never Used  Substance and Sexual Activity   Alcohol use: No    Alcohol/week: 0.0 standard drinks of alcohol   Drug use: No   Sexual activity: Yes    Partners: Male    Birth control/protection: None    Comment: Hysterectomy-Total   Tobacco Counseling Counseling given: No  SDOH Screenings   Food Insecurity: No Food Insecurity (05/21/2024)  Housing: Unknown (05/21/2024)  Transportation Needs: No Transportation Needs (05/21/2024)  Utilities: Not At Risk (05/21/2024)  Alcohol Screen: Low Risk (10/25/2022)  Depression (PHQ2-9): Low Risk (05/21/2024)  Financial Resource Strain: Low Risk (10/25/2022)  Physical Activity: Insufficiently Active (05/21/2024)  Social Connections: Socially Integrated (05/21/2024)  Stress: No Stress Concern Present (05/21/2024)  Tobacco Use: Low Risk (05/21/2024)  Health Literacy: Adequate Health Literacy (05/21/2024)   See flowsheets for full screening  details  Depression Screen PHQ 2 & 9 Depression Scale- Over the past 2 weeks, how often have you been bothered by any of the following problems? Little interest or pleasure in doing things: 0 Feeling down, depressed, or hopeless (PHQ Adolescent also includes...irritable): 1 PHQ-2 Total Score: 1 Trouble falling or staying asleep, or sleeping too much: 0 Feeling tired or having little energy: 0 Poor appetite or overeating (PHQ Adolescent also includes...weight loss): 0 Feeling bad about yourself - or that you are a failure or have let yourself or your family down: 0 Trouble concentrating on things, such as reading the newspaper or watching television (PHQ Adolescent also includes...like school work): 0 Moving or speaking so slowly that other people could have noticed. Or the opposite - being so fidgety or restless that you have been moving around a lot more than usual: 0 Thoughts that you would be better off dead, or of hurting yourself in some way: 0 PHQ-9 Total Score: 1 If you checked off any problems, how difficult have these problems made it for you to do your work, take care of things at home, or get along with other people?: Not difficult at all  Depression Treatment Depression Interventions/Treatment : EYV7-0 Score <4 Follow-up Not Indicated     Goals Addressed             This Visit's Progress    DIET - EAT MORE FRUITS AND VEGETABLES  Objective:    Today's Vitals   05/21/24 0930  BP: (!) 160/110  Weight: 137 lb 11.2 oz (62.5 kg)  Height: 5' 3 (1.6 m)   Body mass index is 24.39 kg/m.  Hearing/Vision screen Hearing Screening - Comments:: NO AIDS Vision Screening - Comments:: WEARS GLASSES FOR DRIVING- [facility] Immunizations and Health Maintenance Health Maintenance  Topic Date Due   Influenza Vaccine  Never done   COVID-19 Vaccine (3 - 2025-26 season) 01/19/2024   Mammogram  12/30/2024   Medicare Annual Wellness (AWV)  05/21/2025    Colonoscopy  10/12/2025   DTaP/Tdap/Td (3 - Td or Tdap) 09/23/2033   Pneumococcal Vaccine: 50+ Years  Completed   Bone Density Scan  Completed   Hepatitis C Screening  Completed   Zoster Vaccines- Shingrix   Completed   Meningococcal B Vaccine  Aged Out        Assessment/Plan:  This is a routine wellness examination for [facility].  Patient Care Team: Sowles, Krichna, MD as PCP - General (Family Medicine) Janit Thresa HERO, DPM as Consulting Physician (Podiatry) Portia Fireman, [place] (Ophthalmology)  I have personally reviewed and noted the following in the patients chart:   Medical and social history Use of alcohol, tobacco or illicit drugs  Current medications and supplements including opioid prescriptions. Functional ability and status Nutritional status Physical activity Advanced directives List of other physicians Hospitalizations, surgeries, and ER visits in previous 12 months Vitals Screenings to include cognitive, depression, and falls Referrals and appointments  No orders of the defined types were placed in this encounter.  In addition, I have reviewed and discussed with patient certain preventive protocols, quality metrics, and best practice recommendations. A written personalized care plan for preventive services as well as general preventive health recommendations were provided to patient.   Jhonnie GORMAN Das, LPN   12/25/7971   Return in 1 year (on 05/21/2025).  After Visit Summary: (In Person-Printed) AVS printed and given to the patient  Nurse Notes: UTD ON SHOTS BUT DOESN'T DO FLU SHOTS; UTD ON MAMMOGRAM, COLONOSCOPY, & BDS    "

## 2024-05-31 NOTE — Patient Instructions (Signed)
 Preventive Care 83 Years and Older, Female Preventive care refers to lifestyle choices and visits with your health care provider that can promote health and wellness. Preventive care visits are also called wellness exams. What can I expect for my preventive care visit? Counseling Your health care provider may ask you questions about your: Medical history, including: Past medical problems. Family medical history. Pregnancy and menstrual history. History of falls. Current health, including: Memory and ability to understand (cognition). Emotional well-being. Home life and relationship well-being. Sexual activity and sexual health. Lifestyle, including: Alcohol, nicotine or tobacco, and drug use. Access to firearms. Diet, exercise, and sleep habits. Work and work Astronomer. Sunscreen use. Safety issues such as seatbelt and bike helmet use. Physical exam Your health care provider will check your: Height and weight. These may be used to calculate your BMI (body mass index). BMI is a measurement that tells if you are at a healthy weight. Waist circumference. This measures the distance around your waistline. This measurement also tells if you are at a healthy weight and may help predict your risk of certain diseases, such as type 2 diabetes and high blood pressure. Heart rate and blood pressure. Body temperature. Skin for abnormal spots. What immunizations do I need?  Vaccines are usually given at various ages, according to a schedule. Your health care provider will recommend vaccines for you based on your age, medical history, and lifestyle or other factors, such as travel or where you work. What tests do I need? Screening Your health care provider may recommend screening tests for certain conditions. This may include: Lipid and cholesterol levels. Hepatitis C test. Hepatitis B test. HIV (human immunodeficiency virus) test. STI (sexually transmitted infection) testing, if you are at  risk. Lung cancer screening. Colorectal cancer screening. Diabetes screening. This is done by checking your blood sugar (glucose) after you have not eaten for a while (fasting). Mammogram. Talk with your health care provider about how often you should have regular mammograms. BRCA-related cancer screening. This may be done if you have a family history of breast, ovarian, tubal, or peritoneal cancers. Bone density scan. This is done to screen for osteoporosis. Talk with your health care provider about your test results, treatment options, and if necessary, the need for more tests. Follow these instructions at home: Eating and drinking  Eat a diet that includes fresh fruits and vegetables, whole grains, lean protein, and low-fat dairy products. Limit your intake of foods with high amounts of sugar, saturated fats, and salt. Take vitamin and mineral supplements as recommended by your health care provider. Do not drink alcohol if your health care provider tells you not to drink. If you drink alcohol: Limit how much you have to 0-1 drink a day. Know how much alcohol is in your drink. In the [place] one drink equals one 12 oz bottle of beer (355 mL), one 5 oz glass of wine (148 mL), or one 1 oz glass of hard liquor (44 mL). Lifestyle Brush your teeth every morning and night with fluoride toothpaste. Floss one time each day. Exercise for at least 30 minutes 5 or more days each week. Do not use any products that contain nicotine or tobacco. These products include cigarettes, chewing tobacco, and vaping devices, such as e-cigarettes. If you need help quitting, ask your health care provider. Do not use drugs. If you are sexually active, practice safe sex. Use a condom or other form of protection in order to prevent STIs. Take aspirin only as told by  your health care provider. Make sure that you understand how much to take and what form to take. Work with your health care provider to find out whether it  is safe and beneficial for you to take aspirin daily. Ask your health care provider if you need to take a cholesterol-lowering medicine (statin). Find healthy ways to manage stress, such as: Meditation, yoga, or listening to music. Journaling. Talking to a trusted person. Spending time with friends and family. Minimize exposure to UV radiation to reduce your risk of skin cancer. Safety Always wear your seat belt while driving or riding in a vehicle. Do not drive: If you have been drinking alcohol. Do not ride with someone who has been drinking. When you are tired or distracted. While texting. If you have been using any mind-altering substances or drugs. Wear a helmet and other protective equipment during sports activities. If you have firearms in your house, make sure you follow all gun safety procedures. What's next? Visit your health care provider once a year for an annual wellness visit. Ask your health care provider how often you should have your eyes and teeth checked. Stay up to date on all vaccines. This information is not intended to replace advice given to you by your health care provider. Make sure you discuss any questions you have with your health care provider. Document Revised: 11/01/2020 Document Reviewed: 11/01/2020 Elsevier Patient Education  2024 [employer].

## 2024-06-01 ENCOUNTER — Other Ambulatory Visit: Payer: Self-pay

## 2024-06-01 ENCOUNTER — Encounter: Payer: Self-pay | Admitting: Family Medicine

## 2024-06-01 ENCOUNTER — Ambulatory Visit (INDEPENDENT_AMBULATORY_CARE_PROVIDER_SITE_OTHER): Admitting: Family Medicine

## 2024-06-01 VITALS — BP 152/96 | HR 83 | Resp 16 | Ht 63.0 in | Wt 136.2 lb

## 2024-06-01 DIAGNOSIS — E785 Hyperlipidemia, unspecified: Secondary | ICD-10-CM

## 2024-06-01 DIAGNOSIS — Z1231 Encounter for screening mammogram for malignant neoplasm of breast: Secondary | ICD-10-CM | POA: Diagnosis not present

## 2024-06-01 DIAGNOSIS — N1831 Chronic kidney disease, stage 3a: Secondary | ICD-10-CM

## 2024-06-01 DIAGNOSIS — I10 Essential (primary) hypertension: Secondary | ICD-10-CM

## 2024-06-01 DIAGNOSIS — N183 Chronic kidney disease, stage 3 unspecified: Secondary | ICD-10-CM

## 2024-06-01 DIAGNOSIS — R739 Hyperglycemia, unspecified: Secondary | ICD-10-CM | POA: Diagnosis not present

## 2024-06-01 DIAGNOSIS — Z0000 Encounter for general adult medical examination without abnormal findings: Secondary | ICD-10-CM | POA: Diagnosis not present

## 2024-06-01 DIAGNOSIS — I129 Hypertensive chronic kidney disease with stage 1 through stage 4 chronic kidney disease, or unspecified chronic kidney disease: Secondary | ICD-10-CM

## 2024-06-01 MED ORDER — AMLODIPINE-ATORVASTATIN 10-40 MG PO TABS: 1.0000 | ORAL_TABLET | Freq: Every day | ORAL | 0 refills | Status: AC

## 2024-06-01 MED ORDER — OLMESARTAN MEDOXOMIL-HCTZ 40-25 MG PO TABS: 1.0000 | ORAL_TABLET | Freq: Every day | ORAL | 0 refills | Status: AC

## 2024-06-01 NOTE — Progress Notes (Signed)
 Name: Bianca Suarez   MRN: [id number]    DOB: May 20, 1951   Date:06/01/2024       Progress Note  Subjective  Chief Complaint  Chief Complaint  Patient presents with   Annual Exam    HPI  Patient presents for annual CPE.  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  Last Eye Exam: completed Last Dental Exam: encouraged to complete  Flowsheet Row Clinical Support from 10/25/2022 in [facility]  AUDIT-C Score 0   Depression: Phq 9 is  positive    06/01/2024    2:22 PM 05/21/2024    9:47 AM 11/11/2022    2:19 PM 10/25/2022    2:39 PM 03/12/2022    9:02 AM  Depression screen PHQ 2/9  Decreased Interest 0 0 0 0 0  Down, Depressed, Hopeless 1 1 0 0 0  PHQ - 2 Score 1 1 0 0 0  Altered sleeping 0 0 0  0  Tired, decreased energy 0 0 0  0  Change in appetite 0 0 0  0  Feeling bad or failure about yourself  0 0 0  0  Trouble concentrating 0 0 0  0  Moving slowly or fidgety/restless 0 0 0  0  Suicidal thoughts 0 0 0  0  PHQ-9 Score 1 1 0   0   Difficult doing work/chores Not difficult at all Not difficult at all        Data saved with a previous flowsheet row definition   Hypertension: BP Readings from Last 3 Encounters:  06/01/24 (!) 184/116  05/21/24 (!) 172/110  10/01/23 (!) 162/87   Obesity: Wt Readings from Last 3 Encounters:  06/01/24 136 lb 3.2 oz (61.8 kg)  05/21/24 137 lb 11.2 oz (62.5 kg)  11/11/22 145 lb (65.8 kg)   BMI Readings from Last 3 Encounters:  06/01/24 24.13 kg/m  05/21/24 24.39 kg/m  11/11/22 25.69 kg/m     Vaccines: reviewed with the patient.   Hep C Screening: completed STD testing and prevention (HIV/chl/gon/syphilis): not interested  Intimate partner violence: negative screen  Sexual History : no pain  Menstrual History/LMP/Abnormal Bleeding: s/p hysterectomy  Discussed importance of follow up  if any post-menopausal bleeding: not applicable  Incontinence Symptoms: negative for symptoms   Breast cancer:  - Last Mammogram: due for a mammogram  - BRCA gene screening: N/A  Osteoporosis Prevention : Discussed high calcium and vitamin D  supplementation, weight bearing exercises Bone density :yes   Cervical cancer screening: not applicable due to hysterectomy  Skin cancer: Discussed monitoring for atypical lesions  Colorectal cancer: up to date    Lung cancer:  Low Dose CT Chest recommended if Age 54-80 years, 20 pack-year currently smoking OR have quit w/in 15years. Patient does not qualify for screen   ECG: next visit   Advanced Care Planning: A voluntary discussion about advance care planning including the explanation and discussion of advance directives.  Discussed health care proxy and Living will, and the patient was able to identify a health care proxy as husband.  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   H/O transient cerebral ischemia 12/24/2014   Benign hypertension 12/24/2014   Lateral epicondylitis of left elbow 12/24/2014    Past Surgical History:  Procedure Laterality Date   ABDOMINAL HYSTERECTOMY  05/20/2002   BIOPSY N/A 10/12/2020   Procedure: BIOPSY;  Surgeon: Unk Corinn Skiff, MD;  Location: [facility];  Service: Endoscopy;  Laterality: N/A;   COLONOSCOPY WITH PROPOFOL  N/A 10/12/2020   Procedure: COLONOSCOPY WITH PROPOFOL ;  Surgeon: Unk Corinn Skiff, MD;  Location: [facility];  Service: Endoscopy;  Laterality: N/A;   COSMETIC SURGERY  2001   lens replacement    ESOPHAGOGASTRODUODENOSCOPY (EGD) WITH PROPOFOL  N/A 10/12/2020   Procedure: ESOPHAGOGASTRODUODENOSCOPY (EGD) WITH PROPOFOL ;  Surgeon: Unk Corinn Skiff, MD;  Location: [facility];  Service: Endoscopy;  Laterality: N/A;   EYE SURGERY     FOOT SURGERY Bilateral 09/29/2017   Bone Spurs Cut Down   POLYPECTOMY N/A 10/12/2020   Procedure: POLYPECTOMY;  Surgeon: Unk Corinn Skiff, MD;  Location: [facility];  Service: Endoscopy;  Laterality: N/A;    Family History  Problem Relation Age of Onset   Diabetes Mother    Hypertension Mother    Hypertension Father    CVA Father    Hypertension Sister    Stroke Maternal Grandmother    Heart disease Paternal Grandmother    Diabetes Paternal Grandmother     Social History   Socioeconomic History   Marital status: Married    Spouse name: Cordella   Number of children: 3   Years of education: Not on file   Highest education level: Associate degree: academic program  Occupational History   Not on file  Tobacco Use   Smoking status: Never   Smokeless tobacco: Never  Vaping Use   Vaping status: Never Used  Substance and Sexual Activity   Alcohol use: No    Alcohol/week: 0.0 standard drinks of alcohol   Drug use: No   Sexual activity: Yes    Partners: Male    Birth control/protection: None    Comment: Hysterectomy-Total  Other Topics Concern   Not on file  Social History Narrative   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   Also likes to decorate for events    Daughter recently  moved back in with her husband and their baby   Social Drivers of Health   Tobacco Use: Low Risk (06/01/2024)   Patient History    Smoking Tobacco Use: Never    Smokeless Tobacco Use: Never    Passive Exposure: Not on file  Financial Resource Strain: Low Risk (10/25/2022)   Overall Financial Resource Strain (CARDIA)    Difficulty of Paying Living Expenses: Not hard at all  Food Insecurity: No Food  Insecurity (05/21/2024)   Epic    Worried About Radiation Protection Practitioner of Food in the Last Year: Never true    Ran Out of Food in the Last Year: Never true  Transportation Needs: No Transportation Needs (05/21/2024)   Epic    Lack of Transportation (Medical): No    Lack of Transportation (Non-Medical): No  Physical Activity: Insufficiently Active (05/21/2024)   Exercise Vital Sign    Days of Exercise per Week: 3 days    Minutes of Exercise per Session: 30 min  Stress: No Stress Concern Present (05/21/2024)   [employer] of Occupational Health - Occupational Stress Questionnaire  Feeling of Stress: Not at all  Social Connections: Socially Integrated (05/21/2024)   Social Connection and Isolation Panel    Frequency of Communication with Friends and Family: Three times a week    Frequency of Social Gatherings with Friends and Family: Once a week    Attends Religious Services: More than 4 times per year    Active Member of Clubs or Organizations: Yes    Attends Banker Meetings: More than 4 times per year    Marital Status: Married  Catering Manager Violence: Not At Risk (05/21/2024)   Epic    Fear of Current or Ex-Partner: No    Emotionally Abused: No    Physically Abused: No    Sexually Abused: No  Depression (PHQ2-9): Low Risk (06/01/2024)   Depression (PHQ2-9)    PHQ-2 Score: 1  Alcohol Screen: Low Risk (10/25/2022)   Alcohol Screen    Last Alcohol Screening Score (AUDIT): 0  Housing: Unknown (05/21/2024)   Epic    Unable to Pay for Housing in the Last Year: No    Number of Times Moved in the Last Year: Not on file    Homeless in the Last Year: No  Utilities: Not At Risk (05/21/2024)   Epic    Threatened with loss of utilities: No  Health Literacy: Adequate Health Literacy (05/21/2024)   B1300 Health Literacy    Frequency of need for help with medical instructions: Never    Current Medications[1]  Allergies[2]   ROS  Ten systems reviewed and is negative except as mentioned  in HPI    Objective  Vitals:   06/01/24 1424  BP: (!) 184/116  Pulse: 83  Resp: 16  SpO2: 98%  Weight: 136 lb 3.2 oz (61.8 kg)  Height: 5' 3 (1.6 m)    Body mass index is 24.13 kg/m.  Physical Exam  Constitutional: Patient appears well-developed and well-nourished. No distress.  HENT: Head: Normocephalic and atraumatic. Ears: B TMs ok, no erythema or effusion; Nose: Nose normal. Mouth/Throat: Oropharynx is clear and moist. No oropharyngeal exudate.  Eyes: Conjunctivae and EOM are normal. Pupils are equal, round, and reactive to light. No scleral icterus.  Neck: Normal range of motion. Neck supple. No JVD present. No thyromegaly present.  Cardiovascular: Normal rate, regular rhythm and normal heart sounds.  No murmur heard. No BLE edema. Pulmonary/Chest: Effort normal and breath sounds normal. No respiratory distress. Abdominal: Soft. Bowel sounds are normal, no distension. There is no tenderness. no masses Breast: no lumps or masses, no nipple discharge or rashes FEMALE GENITALIA: not done  RECTAL: not done  Musculoskeletal: Normal range of motion, no joint effusions. No gross deformities Neurological: he is alert and oriented to person, place, and time. No cranial nerve deficit. Coordination, balance, strength, speech and gait are normal.  Skin: Skin is warm and dry. No rash noted. No erythema.  Psychiatric: Patient has a normal mood and affect. behavior is normal. Judgment and thought content normal.     Assessment & Plan  1. Well adult exam (Primary)   2. Encounter for screening mammogram for malignant neoplasm of breast  - MM 3D SCREENING MAMMOGRAM BILATERAL BREAST; Future  3. Benign hypertension with chronic kidney disease, stage III (HCC)  - CBC with Differential/Platelet - Comprehensive metabolic panel with GFR - Parathyroid  hormone, intact (no Ca)  4. Stage 3a chronic kidney disease (HCC)  - VITAMIN D  25 Hydroxy (Vit-D Deficiency, Fractures)  5.  Dyslipidemia  - Lipid panel  6. Hyperglycemia  -  Hemoglobin A1c    -USPSTF grade A and B recommendations reviewed with patient; age-appropriate recommendations, preventive care, screening tests, etc discussed and encouraged; healthy living encouraged; see AVS for patient education given to patient -Discussed importance of 150 minutes of physical activity weekly, eat two servings of fish weekly, eat one serving of tree nuts ( cashews, pistachios, pecans, almonds.SABRA) every other day, eat 6 servings of fruit/vegetables daily and drink plenty of water  and avoid sweet beverages.   -Reviewed Health Maintenance: Yes.       [1]  Current Outpatient Medications:    olmesartan -hydrochlorothiazide  (BENICAR  HCT) 40-25 MG tablet, Take 1 tablet by mouth once daily, Disp: 90 tablet, Rfl: 0   amLODipine -atorvastatin  (CADUET ) 10-40 MG tablet, Take 1 tablet by mouth daily. (Patient not taking: Reported on 06/01/2024), Disp: 90 tablet, Rfl: 1 [2] No Known Allergies

## 2024-06-02 ENCOUNTER — Ambulatory Visit: Payer: Self-pay | Admitting: Family Medicine

## 2024-06-02 LAB — COMPREHENSIVE METABOLIC PANEL WITH GFR
AG Ratio: 1.1 (calc) (ref 1.0–2.5)
ALT: 20 U/L (ref 6–29)
AST: 22 U/L (ref 10–35)
Albumin: 4.1 g/dL (ref 3.6–5.1)
Alkaline phosphatase (APISO): 103 U/L (ref 37–153)
BUN: 18 mg/dL (ref 7–25)
CO2: 27 mmol/L (ref 20–32)
Calcium: 9.5 mg/dL (ref 8.6–10.4)
Chloride: 101 mmol/L (ref 98–110)
Creat: 0.97 mg/dL (ref 0.60–1.00)
Globulin: 3.7 g/dL (ref 1.9–3.7)
Glucose, Bld: 90 mg/dL (ref 65–99)
Potassium: 4.4 mmol/L (ref 3.5–5.3)
Sodium: 137 mmol/L (ref 135–146)
Total Bilirubin: 0.6 mg/dL (ref 0.2–1.2)
Total Protein: 7.8 g/dL (ref 6.1–8.1)
eGFR: 62 mL/min/1.73m2

## 2024-06-02 LAB — CBC WITH DIFFERENTIAL/PLATELET
Absolute Lymphocytes: 2253 {cells}/uL (ref 850–3900)
Absolute Monocytes: 901 {cells}/uL (ref 200–950)
Basophils Absolute: 68 {cells}/uL (ref 0–200)
Basophils Relative: 0.8 %
Eosinophils Absolute: 94 {cells}/uL (ref 15–500)
Eosinophils Relative: 1.1 %
HCT: 40 % (ref 35.9–46.0)
Hemoglobin: 13.1 g/dL (ref 11.7–15.5)
MCH: 28.7 pg (ref 27.0–33.0)
MCHC: 32.8 g/dL (ref 31.6–35.4)
MCV: 87.7 fL (ref 81.4–101.7)
MPV: 9.7 fL (ref 7.5–12.5)
Monocytes Relative: 10.6 %
Neutro Abs: 5185 {cells}/uL (ref 1500–7800)
Neutrophils Relative %: 61 %
Platelets: 387 Thousand/uL (ref 140–400)
RBC: 4.56 Million/uL (ref 3.80–5.10)
RDW: 12.6 % (ref 11.0–15.0)
Total Lymphocyte: 26.5 %
WBC: 8.5 Thousand/uL (ref 3.8–10.8)

## 2024-06-02 LAB — HEMOGLOBIN A1C
Hgb A1c MFr Bld: 6 % — ABNORMAL HIGH
Mean Plasma Glucose: 126 mg/dL
eAG (mmol/L): 7 mmol/L

## 2024-06-02 LAB — VITAMIN D 25 HYDROXY (VIT D DEFICIENCY, FRACTURES): Vit D, 25-Hydroxy: 26 ng/mL — ABNORMAL LOW (ref 30–100)

## 2024-06-02 LAB — LIPID PANEL
Cholesterol: 203 mg/dL — ABNORMAL HIGH
HDL: 56 mg/dL
LDL Cholesterol (Calc): 129 mg/dL — ABNORMAL HIGH
Non-HDL Cholesterol (Calc): 147 mg/dL — ABNORMAL HIGH
Total CHOL/HDL Ratio: 3.6 (calc)
Triglycerides: 83 mg/dL

## 2024-06-02 LAB — PARATHYROID HORMONE, INTACT (NO CA): PTH: 85 pg/mL — ABNORMAL HIGH (ref 16–77)

## 2024-06-22 ENCOUNTER — Ambulatory Visit: Admitting: Family Medicine

## 2024-06-28 ENCOUNTER — Encounter

## 2024-06-30 ENCOUNTER — Ambulatory Visit: Admitting: Family Medicine

## 2025-05-26 ENCOUNTER — Ambulatory Visit

## 2025-06-06 ENCOUNTER — Encounter: Admitting: Family Medicine
